# Patient Record
Sex: Female | Born: 1955 | Race: Black or African American | Hispanic: No | Marital: Single | Smoking: Former smoker
Health system: Southern US, Community
[De-identification: ages and names within clinical notes are randomized; demographics above are authoritative.]

## PROBLEM LIST (undated history)

## (undated) DIAGNOSIS — H269 Unspecified cataract: Secondary | ICD-10-CM

## (undated) DIAGNOSIS — M1990 Unspecified osteoarthritis, unspecified site: Secondary | ICD-10-CM

## (undated) DIAGNOSIS — I10 Essential (primary) hypertension: Secondary | ICD-10-CM

## (undated) DIAGNOSIS — T7840XA Allergy, unspecified, initial encounter: Secondary | ICD-10-CM

## (undated) HISTORY — DX: Unspecified osteoarthritis, unspecified site: M19.90

## (undated) HISTORY — DX: Allergy, unspecified, initial encounter: T78.40XA

## (undated) HISTORY — DX: Essential (primary) hypertension: I10

## (undated) HISTORY — DX: Unspecified cataract: H26.9

---

## 2002-05-23 ENCOUNTER — Other Ambulatory Visit: Admission: RE | Admit: 2002-05-23 | Discharge: 2002-05-23 | Payer: Self-pay | Admitting: Obstetrics and Gynecology

## 2002-06-15 ENCOUNTER — Encounter: Admission: RE | Admit: 2002-06-15 | Discharge: 2002-06-15 | Payer: Self-pay | Admitting: Obstetrics and Gynecology

## 2002-11-03 ENCOUNTER — Encounter: Payer: Self-pay | Admitting: Obstetrics and Gynecology

## 2002-11-03 ENCOUNTER — Encounter: Admission: RE | Admit: 2002-11-03 | Discharge: 2002-11-03 | Payer: Self-pay | Admitting: Obstetrics and Gynecology

## 2003-06-19 ENCOUNTER — Other Ambulatory Visit: Admission: RE | Admit: 2003-06-19 | Discharge: 2003-06-19 | Payer: Self-pay | Admitting: Obstetrics and Gynecology

## 2004-01-02 ENCOUNTER — Encounter: Admission: RE | Admit: 2004-01-02 | Discharge: 2004-01-02 | Payer: Self-pay | Admitting: Obstetrics and Gynecology

## 2004-07-09 ENCOUNTER — Encounter: Admission: RE | Admit: 2004-07-09 | Discharge: 2004-07-09 | Payer: Self-pay | Admitting: Obstetrics and Gynecology

## 2005-02-12 ENCOUNTER — Encounter: Admission: RE | Admit: 2005-02-12 | Discharge: 2005-02-12 | Payer: Self-pay | Admitting: Obstetrics and Gynecology

## 2005-02-24 ENCOUNTER — Encounter: Admission: RE | Admit: 2005-02-24 | Discharge: 2005-02-24 | Payer: Self-pay | Admitting: Obstetrics and Gynecology

## 2006-02-23 ENCOUNTER — Encounter: Admission: RE | Admit: 2006-02-23 | Discharge: 2006-02-23 | Payer: Self-pay | Admitting: Obstetrics and Gynecology

## 2007-03-04 ENCOUNTER — Encounter: Admission: RE | Admit: 2007-03-04 | Discharge: 2007-03-04 | Payer: Self-pay | Admitting: Obstetrics and Gynecology

## 2007-06-13 ENCOUNTER — Encounter: Admission: RE | Admit: 2007-06-13 | Discharge: 2007-06-13 | Payer: Self-pay | Admitting: Obstetrics and Gynecology

## 2008-03-27 ENCOUNTER — Encounter: Admission: RE | Admit: 2008-03-27 | Discharge: 2008-03-27 | Payer: Self-pay | Admitting: Obstetrics and Gynecology

## 2008-12-24 ENCOUNTER — Emergency Department (HOSPITAL_COMMUNITY): Admission: EM | Admit: 2008-12-24 | Discharge: 2008-12-24 | Payer: Self-pay | Admitting: Internal Medicine

## 2009-03-28 ENCOUNTER — Encounter: Admission: RE | Admit: 2009-03-28 | Discharge: 2009-03-28 | Payer: Self-pay | Admitting: Obstetrics and Gynecology

## 2010-04-14 ENCOUNTER — Encounter: Admission: RE | Admit: 2010-04-14 | Discharge: 2010-04-14 | Payer: Self-pay | Admitting: Obstetrics and Gynecology
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
## 2011-03-10 ENCOUNTER — Other Ambulatory Visit: Payer: Self-pay | Admitting: Obstetrics and Gynecology

## 2011-03-10 DIAGNOSIS — Z1231 Encounter for screening mammogram for malignant neoplasm of breast: Secondary | ICD-10-CM

## 2011-04-17 ENCOUNTER — Ambulatory Visit
Admission: RE | Admit: 2011-04-17 | Discharge: 2011-04-17 | Disposition: A | Discharge status: Home / Self Care | Payer: BC Managed Care – PPO | Source: Ambulatory Visit | Attending: Obstetrics and Gynecology | Admitting: Obstetrics and Gynecology

## 2011-04-17 DIAGNOSIS — Z1231 Encounter for screening mammogram for malignant neoplasm of breast: Secondary | ICD-10-CM

## 2012-03-03 ENCOUNTER — Other Ambulatory Visit: Payer: Self-pay | Admitting: Obstetrics and Gynecology

## 2012-03-03 DIAGNOSIS — Z1231 Encounter for screening mammogram for malignant neoplasm of breast: Secondary | ICD-10-CM

## 2012-04-18 ENCOUNTER — Ambulatory Visit
Admission: RE | Admit: 2012-04-18 | Discharge: 2012-04-18 | Disposition: A | Discharge status: Home / Self Care | Payer: BC Managed Care – PPO | Source: Ambulatory Visit | Attending: Obstetrics and Gynecology | Admitting: Obstetrics and Gynecology

## 2012-04-18 DIAGNOSIS — Z1231 Encounter for screening mammogram for malignant neoplasm of breast: Secondary | ICD-10-CM

## 2013-03-06 ENCOUNTER — Other Ambulatory Visit: Payer: Self-pay

## 2013-03-06 DIAGNOSIS — Z1231 Encounter for screening mammogram for malignant neoplasm of breast: Secondary | ICD-10-CM

## 2013-04-19 ENCOUNTER — Ambulatory Visit
Admission: RE | Admit: 2013-04-19 | Discharge: 2013-04-19 | Disposition: A | Discharge status: Home / Self Care | Payer: BC Managed Care – PPO | Source: Ambulatory Visit

## 2013-04-19 DIAGNOSIS — Z1231 Encounter for screening mammogram for malignant neoplasm of breast: Secondary | ICD-10-CM

## 2013-10-25 ENCOUNTER — Other Ambulatory Visit: Payer: Self-pay | Admitting: Family

## 2013-10-25 ENCOUNTER — Ambulatory Visit
Admission: RE | Admit: 2013-10-25 | Discharge: 2013-10-25 | Disposition: A | Discharge status: Home / Self Care | Payer: BC Managed Care – PPO | Source: Ambulatory Visit | Attending: Family | Admitting: Family

## 2013-10-25 DIAGNOSIS — M25561 Pain in right knee: Secondary | ICD-10-CM

## 2014-03-19 ENCOUNTER — Other Ambulatory Visit: Payer: Self-pay

## 2014-03-19 DIAGNOSIS — Z1231 Encounter for screening mammogram for malignant neoplasm of breast: Secondary | ICD-10-CM

## 2014-04-12 ENCOUNTER — Other Ambulatory Visit: Payer: Self-pay | Admitting: Family

## 2014-04-13 ENCOUNTER — Other Ambulatory Visit: Payer: Self-pay | Admitting: Family

## 2014-04-13 DIAGNOSIS — R51 Headache: Principal | ICD-10-CM

## 2014-04-13 DIAGNOSIS — R519 Headache, unspecified: Secondary | ICD-10-CM

## 2014-04-23 ENCOUNTER — Ambulatory Visit: Payer: BC Managed Care – PPO

## 2014-05-07 ENCOUNTER — Ambulatory Visit
Admission: RE | Admit: 2014-05-07 | Discharge: 2014-05-07 | Disposition: A | Discharge status: Home / Self Care | Payer: BC Managed Care – PPO | Source: Ambulatory Visit

## 2014-05-07 ENCOUNTER — Inpatient Hospital Stay: Admission: RE | Admit: 2014-05-07 | Payer: BC Managed Care – PPO | Source: Ambulatory Visit

## 2014-05-07 ENCOUNTER — Other Ambulatory Visit: Payer: BC Managed Care – PPO

## 2014-05-07 DIAGNOSIS — Z1231 Encounter for screening mammogram for malignant neoplasm of breast: Secondary | ICD-10-CM

## 2014-05-29 ENCOUNTER — Other Ambulatory Visit: Payer: Self-pay | Admitting: Family

## 2014-05-29 ENCOUNTER — Ambulatory Visit
Admission: RE | Admit: 2014-05-29 | Discharge: 2014-05-29 | Disposition: A | Discharge status: Home / Self Care | Payer: BC Managed Care – PPO | Source: Ambulatory Visit | Attending: Family | Admitting: Family

## 2014-05-29 DIAGNOSIS — G4489 Other headache syndrome: Secondary | ICD-10-CM

## 2014-05-29 DIAGNOSIS — R519 Headache, unspecified: Secondary | ICD-10-CM

## 2014-05-29 DIAGNOSIS — R51 Headache: Principal | ICD-10-CM

## 2015-01-23 ENCOUNTER — Other Ambulatory Visit: Payer: Self-pay | Admitting: Obstetrics and Gynecology

## 2015-01-23 DIAGNOSIS — N644 Mastodynia: Secondary | ICD-10-CM

## 2015-02-18 ENCOUNTER — Other Ambulatory Visit: Payer: Self-pay | Admitting: Family

## 2015-02-18 DIAGNOSIS — N644 Mastodynia: Secondary | ICD-10-CM

## 2015-02-27 ENCOUNTER — Ambulatory Visit
Admission: RE | Admit: 2015-02-27 | Discharge: 2015-02-27 | Disposition: A | Discharge status: Home / Self Care | Payer: BC Managed Care – PPO | Source: Ambulatory Visit | Attending: Family | Admitting: Family

## 2015-02-27 DIAGNOSIS — N644 Mastodynia: Secondary | ICD-10-CM

## 2015-03-06 ENCOUNTER — Other Ambulatory Visit: Payer: Self-pay

## 2015-03-06 ENCOUNTER — Other Ambulatory Visit: Payer: Self-pay | Admitting: Family

## 2015-03-06 DIAGNOSIS — Z1231 Encounter for screening mammogram for malignant neoplasm of breast: Secondary | ICD-10-CM

## 2015-04-17 ENCOUNTER — Ambulatory Visit
Admission: RE | Admit: 2015-04-17 | Discharge: 2015-04-17 | Disposition: A | Discharge status: Home / Self Care | Payer: BC Managed Care – PPO | Source: Ambulatory Visit | Attending: Internal Medicine | Admitting: Internal Medicine

## 2015-04-17 ENCOUNTER — Other Ambulatory Visit: Payer: Self-pay | Admitting: Nurse Practitioner

## 2015-04-17 DIAGNOSIS — M25471 Effusion, right ankle: Secondary | ICD-10-CM

## 2015-05-10 ENCOUNTER — Ambulatory Visit: Payer: BC Managed Care – PPO

## 2015-05-31 ENCOUNTER — Ambulatory Visit
Admission: RE | Admit: 2015-05-31 | Discharge: 2015-05-31 | Disposition: A | Discharge status: Home / Self Care | Payer: BC Managed Care – PPO | Source: Ambulatory Visit

## 2015-05-31 DIAGNOSIS — Z1231 Encounter for screening mammogram for malignant neoplasm of breast: Secondary | ICD-10-CM

## 2015-10-30 ENCOUNTER — Other Ambulatory Visit: Payer: Self-pay | Admitting: Obstetrics and Gynecology

## 2015-10-31 ENCOUNTER — Other Ambulatory Visit: Payer: Self-pay | Admitting: Obstetrics and Gynecology

## 2015-10-31 DIAGNOSIS — N644 Mastodynia: Secondary | ICD-10-CM

## 2015-11-05 ENCOUNTER — Ambulatory Visit
Admission: RE | Admit: 2015-11-05 | Discharge: 2015-11-05 | Disposition: A | Discharge status: Home / Self Care | Payer: BC Managed Care – PPO | Source: Ambulatory Visit | Attending: Obstetrics and Gynecology | Admitting: Obstetrics and Gynecology

## 2015-11-05 DIAGNOSIS — N644 Mastodynia: Secondary | ICD-10-CM

## 2016-04-27 ENCOUNTER — Other Ambulatory Visit: Payer: Self-pay | Admitting: Nurse Practitioner

## 2016-04-27 DIAGNOSIS — Z1231 Encounter for screening mammogram for malignant neoplasm of breast: Secondary | ICD-10-CM

## 2016-06-03 ENCOUNTER — Ambulatory Visit
Admission: RE | Admit: 2016-06-03 | Discharge: 2016-06-03 | Disposition: A | Discharge status: Home / Self Care | Payer: BC Managed Care – PPO | Source: Ambulatory Visit | Attending: Nurse Practitioner | Admitting: Nurse Practitioner

## 2016-06-03 DIAGNOSIS — Z1231 Encounter for screening mammogram for malignant neoplasm of breast: Secondary | ICD-10-CM

## 2016-12-07 ENCOUNTER — Other Ambulatory Visit: Payer: Self-pay | Admitting: Nurse Practitioner

## 2016-12-07 DIAGNOSIS — R5381 Other malaise: Secondary | ICD-10-CM

## 2016-12-21 ENCOUNTER — Other Ambulatory Visit: Payer: Self-pay | Admitting: Nurse Practitioner

## 2016-12-21 DIAGNOSIS — E2839 Other primary ovarian failure: Secondary | ICD-10-CM

## 2016-12-23 ENCOUNTER — Ambulatory Visit
Admission: RE | Admit: 2016-12-23 | Discharge: 2016-12-23 | Disposition: A | Discharge status: Home / Self Care | Payer: BC Managed Care – PPO | Source: Ambulatory Visit | Attending: Nurse Practitioner | Admitting: Nurse Practitioner

## 2016-12-23 DIAGNOSIS — E2839 Other primary ovarian failure: Secondary | ICD-10-CM

## 2017-04-19 ENCOUNTER — Other Ambulatory Visit: Payer: Self-pay | Admitting: Nurse Practitioner

## 2017-04-19 ENCOUNTER — Other Ambulatory Visit: Payer: Self-pay | Admitting: Obstetrics and Gynecology

## 2017-04-19 DIAGNOSIS — Z1231 Encounter for screening mammogram for malignant neoplasm of breast: Secondary | ICD-10-CM

## 2017-06-04 ENCOUNTER — Ambulatory Visit
Admission: RE | Admit: 2017-06-04 | Discharge: 2017-06-04 | Disposition: A | Discharge status: Home / Self Care | Payer: BC Managed Care – PPO | Source: Ambulatory Visit | Attending: Obstetrics and Gynecology | Admitting: Obstetrics and Gynecology

## 2017-06-04 DIAGNOSIS — Z1231 Encounter for screening mammogram for malignant neoplasm of breast: Secondary | ICD-10-CM

## 2017-08-16 ENCOUNTER — Other Ambulatory Visit: Payer: Self-pay | Admitting: Nurse Practitioner

## 2017-08-16 DIAGNOSIS — E2839 Other primary ovarian failure: Secondary | ICD-10-CM

## 2018-02-03 DIAGNOSIS — N08 Glomerular disorders in diseases classified elsewhere: Secondary | ICD-10-CM

## 2018-02-03 DIAGNOSIS — I10 Essential (primary) hypertension: Secondary | ICD-10-CM

## 2018-02-03 DIAGNOSIS — E782 Mixed hyperlipidemia: Secondary | ICD-10-CM | POA: Diagnosis not present

## 2018-02-03 DIAGNOSIS — N390 Urinary tract infection, site not specified: Secondary | ICD-10-CM

## 2018-02-03 DIAGNOSIS — J111 Influenza due to unidentified influenza virus with other respiratory manifestations: Secondary | ICD-10-CM

## 2018-02-03 DIAGNOSIS — R229 Localized swelling, mass and lump, unspecified: Secondary | ICD-10-CM

## 2018-03-01 ENCOUNTER — Other Ambulatory Visit: Payer: Self-pay | Admitting: Nurse Practitioner

## 2018-03-01 DIAGNOSIS — R229 Localized swelling, mass and lump, unspecified: Secondary | ICD-10-CM

## 2018-03-03 ENCOUNTER — Ambulatory Visit
Admission: RE | Admit: 2018-03-03 | Discharge: 2018-03-03 | Disposition: A | Discharge status: Home / Self Care | Payer: BC Managed Care – PPO | Source: Ambulatory Visit | Attending: Nurse Practitioner | Admitting: Nurse Practitioner

## 2018-03-03 ENCOUNTER — Other Ambulatory Visit: Payer: BC Managed Care – PPO

## 2018-03-03 DIAGNOSIS — R229 Localized swelling, mass and lump, unspecified: Secondary | ICD-10-CM

## 2018-03-08 ENCOUNTER — Telehealth: Payer: Self-pay

## 2018-03-08 NOTE — Telephone Encounter (Signed)
Pt left a message on patient portal in [facility] requesting her lab results from her last visit. YRL,RMA

## 2018-03-09 NOTE — Telephone Encounter (Signed)
Kidney functions are stable. White count is normal.  These are from 02/18/18

## 2018-03-09 NOTE — Telephone Encounter (Signed)
Left pt v/m to call office regarding labs she was requesting. YRL,RMA

## 2018-03-10 NOTE — Telephone Encounter (Signed)
Urine culture was negative.

## 2018-03-10 NOTE — Telephone Encounter (Signed)
Patient notified she also wanted to know the results for her recheck on the urine?

## 2018-03-11 NOTE — Telephone Encounter (Signed)
Left pt v/m to call office. YRL,RMA 

## 2018-03-15 NOTE — Telephone Encounter (Signed)
Left pt v/m to call office. YRL,RMA 

## 2018-03-17 NOTE — Telephone Encounter (Signed)
Left pt v/m to call office. YRL,RMA 

## 2018-03-18 ENCOUNTER — Telehealth: Payer: Self-pay

## 2018-03-18 NOTE — Telephone Encounter (Signed)
I called patient and informed her the results of her ultrasound was sent to her last week with the interpretation.

## 2018-03-18 NOTE — Telephone Encounter (Signed)
Patient called requesting results from her ultrasound? YRL,RMA

## 2018-03-18 NOTE — Telephone Encounter (Signed)
Patient notified YRL,RMA 

## 2018-04-25 ENCOUNTER — Other Ambulatory Visit: Payer: Self-pay | Admitting: Obstetrics and Gynecology

## 2018-04-25 ENCOUNTER — Other Ambulatory Visit: Payer: Self-pay | Admitting: Nurse Practitioner

## 2018-04-25 DIAGNOSIS — Z1231 Encounter for screening mammogram for malignant neoplasm of breast: Secondary | ICD-10-CM

## 2018-04-25 DIAGNOSIS — E2839 Other primary ovarian failure: Secondary | ICD-10-CM

## 2018-06-06 ENCOUNTER — Ambulatory Visit
Admission: RE | Admit: 2018-06-06 | Discharge: 2018-06-06 | Disposition: A | Discharge status: Home / Self Care | Payer: BC Managed Care – PPO | Source: Ambulatory Visit | Attending: Obstetrics and Gynecology | Admitting: Obstetrics and Gynecology

## 2018-06-06 DIAGNOSIS — Z1231 Encounter for screening mammogram for malignant neoplasm of breast: Secondary | ICD-10-CM

## 2018-06-09 ENCOUNTER — Other Ambulatory Visit: Payer: Self-pay | Admitting: Nurse Practitioner

## 2018-08-10 ENCOUNTER — Encounter: Payer: Self-pay | Admitting: Nurse Practitioner

## 2018-08-30 ENCOUNTER — Encounter: Payer: Self-pay | Admitting: Internal Medicine

## 2018-08-30 ENCOUNTER — Other Ambulatory Visit: Payer: Self-pay

## 2018-08-30 ENCOUNTER — Encounter: Payer: Self-pay | Admitting: Nurse Practitioner

## 2018-08-30 ENCOUNTER — Ambulatory Visit: Payer: BC Managed Care – PPO | Admitting: Nurse Practitioner

## 2018-08-30 VITALS — BP 130/72 | HR 62 | Temp 98.4°F | Ht 62.0 in | Wt 188.8 lb

## 2018-08-30 DIAGNOSIS — R7303 Prediabetes: Secondary | ICD-10-CM | POA: Insufficient documentation

## 2018-08-30 DIAGNOSIS — I10 Essential (primary) hypertension: Secondary | ICD-10-CM | POA: Diagnosis not present

## 2018-08-30 DIAGNOSIS — B309 Viral conjunctivitis, unspecified: Secondary | ICD-10-CM | POA: Diagnosis not present

## 2018-08-30 DIAGNOSIS — Z0000 Encounter for general adult medical examination without abnormal findings: Secondary | ICD-10-CM

## 2018-08-30 LAB — POCT URINALYSIS DIPSTICK
Bilirubin, UA: NEGATIVE
GLUCOSE UA: NEGATIVE
Ketones, UA: NEGATIVE
LEUKOCYTES UA: NEGATIVE
Nitrite, UA: NEGATIVE
PROTEIN UA: NEGATIVE
Spec Grav, UA: >=1.03 — AB (ref 1.010–1.025)
Urobilinogen, UA: 0.2 E.U./dL
pH, UA: 6 (ref 5.0–8.0)

## 2018-08-30 LAB — POCT UA - MICROALBUMIN
Albumin/Creatinine Ratio, Urine, POC: <30
CREATININE, POC: 200 mg/dL
MICROALBUMIN (UR) POC: 30 mg/L

## 2018-08-30 NOTE — Patient Instructions (Addendum)
Health Maintenance, Female Adopting a healthy lifestyle and getting preventive care can go a long way to promote health and wellness. Talk with your health care provider about what schedule of regular examinations is right for you. This is a good chance for you to check in with your provider about disease prevention and staying healthy. In between checkups, there are plenty of things you can do on your own. Experts have done a lot of research about which lifestyle changes and preventive measures are most likely to keep you healthy. Ask your health care provider for more information. Weight and diet Eat a healthy diet  Be sure to include plenty of vegetables, fruits, low-fat dairy products, and lean protein.  Do not eat a lot of foods high in solid fats, added sugars, or salt.  Get regular exercise. This is one of the most important things you can do for your health. ? Most adults should exercise for at least 150 minutes each week. The exercise should increase your heart rate and make you sweat (moderate-intensity exercise). ? Most adults should also do strengthening exercises at least twice a week. This is in addition to the moderate-intensity exercise. Maintain a healthy weight  Body mass index (BMI) is a measurement that can be used to identify possible weight problems. It estimates body fat based on height and weight. Your health care provider can help determine your BMI and help you achieve or maintain a healthy weight.  For females 20 years of age and older: ? A BMI below 18.5 is considered underweight. ? A BMI of 18.5 to 24.9 is normal. ? A BMI of 25 to 29.9 is considered overweight. ? A BMI of 30 and above is considered obese. Watch levels of cholesterol and blood lipids  You should start having your blood tested for lipids and cholesterol at 63 years of age, then have this test every 5 years.  You may need to have your cholesterol levels checked more often if: ? Your lipid or  cholesterol levels are high. ? You are older than 63 years of age. ? You are at high risk for heart disease. Cancer screening Lung Cancer  Lung cancer screening is recommended for adults 55-80 years old who are at high risk for lung cancer because of a history of smoking.  A yearly low-dose CT scan of the lungs is recommended for people who: ? Currently smoke. ? Have quit within the past 15 years. ? Have at least a 30-pack-year history of smoking. A pack year is smoking an average of one pack of cigarettes a day for 1 year.  Yearly screening should continue until it has been 15 years since you quit.  Yearly screening should stop if you develop a health problem that would prevent you from having lung cancer treatment. Breast Cancer  Practice breast self-awareness. This means understanding how your breasts normally appear and feel.  It also means doing regular breast self-exams. Let your health care provider know about any changes, no matter how small.  If you are in your 20s or 30s, you should have a clinical breast exam (CBE) by a health care provider every 1-3 years as part of a regular health exam.  If you are 40 or older, have a CBE every year. Also consider having a breast X-ray (mammogram) every year.  If you have a family history of breast cancer, talk to your health care provider about genetic screening.  If you are at high risk for breast cancer, talk   to your health care provider about having an MRI and a mammogram every year.  Breast cancer gene (BRCA) assessment is recommended for women who have family members with BRCA-related cancers. BRCA-related cancers include: ? Breast. ? Ovarian. ? Tubal. ? Peritoneal cancers.  Results of the assessment will determine the need for genetic counseling and BRCA1 and BRCA2 testing. Cervical Cancer Your health care provider may recommend that you be screened regularly for cancer of the pelvic organs (ovaries, uterus, and vagina).  This screening involves a pelvic examination, including checking for microscopic changes to the surface of your cervix (Pap test). You may be encouraged to have this screening done every 3 years, beginning at age 21.  For women ages 30-65, health care providers may recommend pelvic exams and Pap testing every 3 years, or they may recommend the Pap and pelvic exam, combined with testing for human papilloma virus (HPV), every 5 years. Some types of HPV increase your risk of cervical cancer. Testing for HPV may also be done on women of any age with unclear Pap test results.  Other health care providers may not recommend any screening for nonpregnant women who are considered low risk for pelvic cancer and who do not have symptoms. Ask your health care provider if a screening pelvic exam is right for you.  If you have had past treatment for cervical cancer or a condition that could lead to cancer, you need Pap tests and screening for cancer for at least 20 years after your treatment. If Pap tests have been discontinued, your risk factors (such as having a new sexual partner) need to be reassessed to determine if screening should resume. Some women have medical problems that increase the chance of getting cervical cancer. In these cases, your health care provider may recommend more frequent screening and Pap tests. Colorectal Cancer  This type of cancer can be detected and often prevented.  Routine colorectal cancer screening usually begins at 63 years of age and continues through 63 years of age.  Your health care provider may recommend screening at an earlier age if you have risk factors for colon cancer.  Your health care provider may also recommend using home test kits to check for hidden blood in the stool.  A small camera at the end of a tube can be used to examine your colon directly (sigmoidoscopy or colonoscopy). This is done to check for the earliest forms of colorectal cancer.  Routine  screening usually begins at age 50.  Direct examination of the colon should be repeated every 5-10 years through 63 years of age. However, you may need to be screened more often if early forms of precancerous polyps or small growths are found. Skin Cancer  Check your skin from head to toe regularly.  Tell your health care provider about any new moles or changes in moles, especially if there is a change in a mole's shape or color.  Also tell your health care provider if you have a mole that is larger than the size of a pencil eraser.  Always use sunscreen. Apply sunscreen liberally and repeatedly throughout the day.  Protect yourself by wearing long sleeves, pants, a wide-brimmed hat, and sunglasses whenever you are outside. Heart disease, diabetes, and high blood pressure  High blood pressure causes heart disease and increases the risk of stroke. High blood pressure is more likely to develop in: ? People who have blood pressure in the high end of the normal range (130-139/85-89 mm Hg). ? People   who are overweight or obese. ? People who are African American.  If you are 84-22 years of age, have your blood pressure checked every 3-5 years. If you are 67 years of age or older, have your blood pressure checked every year. You should have your blood pressure measured twice-once when you are at a hospital or clinic, and once when you are not at a hospital or clinic. Record the average of the two measurements. To check your blood pressure when you are not at a hospital or clinic, you can use: ? An automated blood pressure machine at a pharmacy. ? A home blood pressure monitor.  If you are between 52 years and 3 years old, ask your health care provider if you should take aspirin to prevent strokes.  Have regular diabetes screenings. This involves taking a blood sample to check your fasting blood sugar level. ? If you are at a normal weight and have a low risk for diabetes, have this test once  every three years after 63 years of age. ? If you are overweight and have a high risk for diabetes, consider being tested at a younger age or more often. Preventing infection Hepatitis B  If you have a higher risk for hepatitis B, you should be screened for this virus. You are considered at high risk for hepatitis B if: ? You were born in a country where hepatitis B is common. Ask your health care provider which countries are considered high risk. ? Your parents were born in a high-risk country, and you have not been immunized against hepatitis B (hepatitis B vaccine). ? You have HIV or AIDS. ? You use needles to inject street drugs. ? You live with someone who has hepatitis B. ? You have had sex with someone who has hepatitis B. ? You get hemodialysis treatment. ? You take certain medicines for conditions, including cancer, organ transplantation, and autoimmune conditions. Hepatitis C  Blood testing is recommended for: ? Everyone born from [birth year] through 1965. ? Anyone with known risk factors for hepatitis C. Sexually transmitted infections (STIs)  You should be screened for sexually transmitted infections (STIs) including gonorrhea and chlamydia if: ? You are sexually active and are younger than 63 years of age. ? You are older than 63 years of age and your health care provider tells you that you are at risk for this type of infection. ? Your sexual activity has changed since you were last screened and you are at an increased risk for chlamydia or gonorrhea. Ask your health care provider if you are at risk.  If you do not have HIV, but are at risk, it may be recommended that you take a prescription medicine daily to prevent HIV infection. This is called pre-exposure prophylaxis (PrEP). You are considered at risk if: ? You are sexually active and do not regularly use condoms or know the HIV status of your partner(s). ? You take drugs by injection. ? You are sexually active with a partner  who has HIV. Talk with your health care provider about whether you are at high risk of being infected with HIV. If you choose to begin PrEP, you should first be tested for HIV. You should then be tested every 3 months for as long as you are taking PrEP. Pregnancy  If you are premenopausal and you may become pregnant, ask your health care provider about preconception counseling.  If you may become pregnant, take 400 to 800 micrograms (mcg) of folic acid every  day.  If you want to prevent pregnancy, talk to your health care provider about birth control (contraception). Osteoporosis and menopause  Osteoporosis is a disease in which the bones lose minerals and strength with aging. This can result in serious bone fractures. Your risk for osteoporosis can be identified using a bone density scan.  If you are 60 years of age or older, or if you are at risk for osteoporosis and fractures, ask your health care provider if you should be screened.  Ask your health care provider whether you should take a calcium or vitamin D supplement to lower your risk for osteoporosis.  Menopause may have certain physical symptoms and risks.  Hormone replacement therapy may reduce some of these symptoms and risks. Talk to your health care provider about whether hormone replacement therapy is right for you. Follow these instructions at home:  Schedule regular health, dental, and eye exams.  Stay current with your immunizations.  Do not use any tobacco products including cigarettes, chewing tobacco, or electronic cigarettes.  If you are pregnant, do not drink alcohol.  If you are breastfeeding, limit how much and how often you drink alcohol.  Limit alcohol intake to no more than 1 drink per day for nonpregnant women. One drink equals 12 ounces of beer, 5 ounces of wine, or 1 ounces of hard liquor.  Do not use street drugs.  Do not share needles.  Ask your health care provider for help if you need support  or information about quitting drugs.  Tell your health care provider if you often feel depressed.  Tell your health care provider if you have ever been abused or do not feel safe at home. This information is not intended to replace advice given to you by your health care provider. Make sure you discuss any questions you have with your health care provider. Document Released: 12/01/2010 Document Revised: 10/24/2015 Document Reviewed: 02/19/2015 [employer] Interactive Patient Education  2019 [employer]   Viral Conjunctivitis, Adult  Viral conjunctivitis is an inflammation of the clear membrane that covers the white part of your eye and the inner surface of your eyelid (conjunctiva). The inflammation is caused by a viral infection. The blood vessels in the conjunctiva become inflamed, causing the eye to become red or pink, and often itchy. Viral conjunctivitis can be easily passed from one person to another (is contagious). This condition is often called pink eye. What are the causes? This condition is caused by a virus. A virus is a type of contagious germ. It can be spread by touching objects that have been contaminated with the virus, such as doorknobs or towels. It can also be passed through droplets, such as from coughing or sneezing. What are the signs or symptoms? Symptoms of this condition include:  Eye redness.  Tearing or watery eyes.  Itchy and irritated eyes.  Burning feeling in the eyes.  Clear drainage from the eye.  Swollen eyelids.  A gritty feeling in the eye.  Light sensitivity. This condition often occurs with other symptoms, such as a fever, nausea, or a rash. How is this diagnosed? This condition is diagnosed with a medical history and physical exam. If you have discharge from your eye, the discharge may be tested to rule out other causes of conjunctivitis. How is this treated? Viral conjunctivitis does not respond to medicines that kill bacteria  (antibiotics). Treatment for viral conjunctivitis is directed at stopping a bacterial infection from developing in addition to the viral infection. Treatment also  aims to relieve your symptoms, such as itching. This may be done with antihistamine drops or other eye medicines. Rarely, steroid eye drops or antiviral medicines may be prescribed. Follow these instructions at home: Medicines   Take or apply over-the-counter and prescription medicines only as told by your health care provider.  Be very careful to avoid touching the edge of the eyelid with the eye drop bottle or ointment tube when applying medicines to the affected eye. Being careful this way will stop you from spreading the infection to the other eye or to other people. Eye care  Avoid touching or rubbing your eyes.  Apply a warm, wet, clean washcloth to your eye for 10-20 minutes, 3-4 times per day or as told by your health care provider.  If you wear contact lenses, do not wear them until the inflammation is gone and your health care provider says it is safe to wear them again. Ask your health care provider how to sterilize or replace your contact lenses before using them again. Wear glasses until you can resume wearing contacts.  Avoid wearing eye makeup until the inflammation is gone. Throw away any old eye cosmetics that may be contaminated.  Gently wipe away any drainage from your eye with a warm, wet washcloth or a cotton ball. General instructions  Change or wash your pillowcase every day or as told by your health care provider.  Do not share towels, pillowcases, washcloths, eye makeup, makeup brushes, contact lenses, or glasses. This may spread the infection.  Wash your hands often with soap and water. Use paper towels to dry your hands. If soap and water are not available, use hand sanitizer.  Try to avoid contact with other people for one week or as told by your health care provider. Contact a health care provider  if:  Your symptoms do not improve with treatment or they get worse.  You have increased pain.  Your vision becomes blurry.  You have a fever.  You have facial pain, redness, or swelling.  You have yellow or green drainage coming from your eye.  You have new symptoms. This information is not intended to replace advice given to you by your health care provider. Make sure you discuss any questions you have with your health care provider. Document Released: 08/08/2002 Document Revised: 12/14/2015 Document Reviewed: 12/03/2015 [employer] Interactive Patient Education  2019 [place] [employer]YE [employer] - [place]

## 2018-08-30 NOTE — Progress Notes (Signed)
poc

## 2018-08-30 NOTE — Progress Notes (Signed)
Subjective:     Patient ID: Joanna Powell , female    DOB: [birth year] , 63 y.o.   MRN: [id number]   Chief Complaint  Patient presents with  . Annual Exam   The patient states she uses post menopausal status for birth control. Last LMP was No LMP recorded. Patient is postmenopausal.. Negative for Dysmenorrhea and Negative for Menorrhagia Mammogram last done 06/06/2018.  Negative for: breast discharge, breast lump(s), breast pain and breast self exam.  Pertinent negatives include abnormal bleeding (hematology), anxiety, decreased libido, depression, difficulty falling sleep, dyspareunia, history of infertility, nocturia, sexual dysfunction, sleep disturbances, urinary incontinence, urinary urgency, vaginal discharge and vaginal itching. Diet regular. The patient states her exercise level is   minimally.     The patient's tobacco use is:  Social History   Tobacco Use  Smoking Status Former Smoker  Smokeless Tobacco Never Used   She has been exposed to passive smoke. The patient's alcohol use is:  Social History   Substance and Sexual Activity  Alcohol Use Yes   Comment: occasional   Additional information: Last pap 2019 with Dr. Cherly Hensen, next one scheduled for April/May 2020 HPI  Here for HM  Hyperlipidemia - doing well.   Hypertension  Pertinent negatives include no blurred vision. Risk factors for coronary artery disease include dyslipidemia and sedentary lifestyle. Past treatments include angiotensin blockers. Compliance problems include exercise.  There is no history of angina.  Eye Problem   The left eye is affected. This is a new problem. The current episode started in the past 7 days. The problem occurs constantly. There was no injury mechanism. The patient is experiencing no pain. There is no known exposure to pink eye. She does not wear contacts. Associated symptoms include eye redness. Pertinent negatives include no blurred vision. She has tried nothing for the symptoms.       History reviewed. No pertinent past medical history.   Family History  Problem Relation Age of Onset  . Breast cancer Sister   . Cancer Mother   . Hypertension Mother   . Hypertension Father   . Heart disease Father      Current Outpatient Medications:  .  atorvastatin (LIPITOR) 10 MG tablet, Take 10 mg by mouth daily., Disp: , Rfl:  .  olmesartan (BENICAR) 40 MG tablet, TAKE 1 TABLET BY MOUTH EVERY DAY, Disp: 90 tablet, Rfl: 1   Not on File   Review of Systems  Eyes: Positive for redness. Negative for blurred vision.    Today's Vitals   08/30/18 1103  BP: 130/72  Pulse: 62  Temp: 98.4 F (36.9 C)  TempSrc: Oral  SpO2: 95%  Weight: 188 lb 12.8 oz (85.6 kg)  Height: 5\' 2"  (1.575 m)   Body mass index is 34.53 kg/m.   Objective:  Physical Exam Constitutional:      General: She is not in acute distress.    Appearance: Normal appearance. She is well-developed.  HENT:     Head: Normocephalic and atraumatic.     Right Ear: Hearing, tympanic membrane, ear canal and external ear normal.     Left Ear: Hearing, tympanic membrane, ear canal and external ear normal.     Nose: Nose normal.     Mouth/Throat:     Mouth: Mucous membranes are moist.  Eyes:     General: Lids are normal.        Left eye: Discharge (Dried crust near inner) present.    Extraocular Movements: Extraocular movements intact.  Pupils: Pupils are equal, round, and reactive to light.     Funduscopic exam:    Right eye: No papilledema.        Left eye: No papilledema.  Neck:     Musculoskeletal: Full passive range of motion without pain, normal range of motion and neck supple.     Thyroid: No thyroid mass.     Vascular: No carotid bruit.  Cardiovascular:     Rate and Rhythm: Normal rate and regular rhythm.     Pulses: Normal pulses.     Heart sounds: Normal heart sounds. No murmur.  Pulmonary:     Effort: Pulmonary effort is normal. No respiratory distress.     Breath sounds: Normal breath  sounds.  Abdominal:     General: Abdomen is flat. Bowel sounds are normal.     Palpations: Abdomen is soft.  Musculoskeletal: Normal range of motion.        General: No swelling.     Right lower leg: No edema.     Left lower leg: No edema.  Skin:    General: Skin is warm and dry.     Capillary Refill: Capillary refill takes less than 2 seconds.  Neurological:     General: No focal deficit present.     Mental Status: She is alert and oriented to person, place, and time.     Cranial Nerves: No cranial nerve deficit.     Sensory: No sensory deficit.  Psychiatric:        Mood and Affect: Mood normal.        Behavior: Behavior normal.        Thought Content: Thought content normal.        Judgment: Judgment normal.         Assessment And Plan:      1. Health maintenance examination . Behavior modifications discussed and diet history reviewed.   . Pt will continue to exercise regularly and modify diet with low GI, plant based foods and decrease intake of processed foods.  . Recommend intake of daily multivitamin, Vitamin D, and calcium.  . Recommend mammogram and colonoscopy for preventive screenings, as well as recommend immunizations that include influenza, TDAP - POCT Urinalysis Dipstick (81002) - POCT UA - Microalbumin - Hemoglobin A1c - Lipid panel - CMP14 + Anion Gap - CBC no Diff  2. Essential hypertension . B/P is controlled.  . CMP ordered to check renal function.  . The importance of regular exercise and dietary modification was stressed to the patient.   3. Viral conjunctivitis of left eye  No matting noted,   2 day history of eye matting in am, no antibiotics at this time.    Good handwashing  4. Prediabetes  Chronic, stable  No current medications  Encouraged to limit intake of sugary foods and drinks  Encouraged to increase physical activity to 150 minutes per week   Arnette Felts, FNP

## 2018-08-31 LAB — CMP14 + ANION GAP
ALT: 15 IU/L (ref 0–32)
ANION GAP: 14 mmol/L (ref 10.0–18.0)
AST: 20 IU/L (ref 0–40)
Albumin/Globulin Ratio: 1.6 (ref 1.2–2.2)
Albumin: 4.6 g/dL (ref 3.8–4.8)
Alkaline Phosphatase: 94 IU/L (ref 39–117)
BUN/Creatinine Ratio: 19 (ref 12–28)
BUN: 18 mg/dL (ref 8–27)
Bilirubin Total: 0.6 mg/dL (ref 0.0–1.2)
CO2: 26 mmol/L (ref 20–29)
CREATININE: 0.96 mg/dL (ref 0.57–1.00)
Calcium: 9.8 mg/dL (ref 8.7–10.3)
Chloride: 103 mmol/L (ref 96–106)
GFR calc Af Amer: 73 mL/min/{1.73_m2} (ref >59)
GFR calc non Af Amer: 63 mL/min/{1.73_m2} (ref >59)
Globulin, Total: 2.8 g/dL (ref 1.5–4.5)
Glucose: 88 mg/dL (ref 65–99)
Potassium: 5 mmol/L (ref 3.5–5.2)
Sodium: 143 mmol/L (ref 134–144)
Total Protein: 7.4 g/dL (ref 6.0–8.5)

## 2018-08-31 LAB — CBC
HEMOGLOBIN: 13.4 g/dL (ref 11.1–15.9)
Hematocrit: 41.1 % (ref 34.0–46.6)
MCH: 26.5 pg — ABNORMAL LOW (ref 26.6–33.0)
MCHC: 32.6 g/dL (ref 31.5–35.7)
MCV: 81 fL (ref 79–97)
Platelets: 436 10*3/uL (ref 150–450)
RBC: 5.06 x10E6/uL (ref 3.77–5.28)
RDW: 14 % (ref 11.7–15.4)
WBC: 4.7 10*3/uL (ref 3.4–10.8)

## 2018-08-31 LAB — HEMOGLOBIN A1C
Est. average glucose Bld gHb Est-mCnc: 120 mg/dL
Hgb A1c MFr Bld: 5.8 % — ABNORMAL HIGH (ref 4.8–5.6)

## 2018-08-31 LAB — LIPID PANEL
CHOL/HDL RATIO: 3.8 ratio (ref 0.0–4.4)
Cholesterol, Total: 220 mg/dL — ABNORMAL HIGH (ref 100–199)
HDL: 58 mg/dL (ref >39)
LDL CALC: 138 mg/dL — AB (ref 0–99)
Triglycerides: 122 mg/dL (ref 0–149)
VLDL Cholesterol Cal: 24 mg/dL (ref 5–40)

## 2018-09-28 ENCOUNTER — Other Ambulatory Visit: Payer: Self-pay | Admitting: Nurse Practitioner

## 2018-10-06 ENCOUNTER — Encounter: Payer: Self-pay | Admitting: Nurse Practitioner

## 2018-12-26 ENCOUNTER — Ambulatory Visit
Admission: RE | Admit: 2018-12-26 | Discharge: 2018-12-26 | Disposition: A | Discharge status: Home / Self Care | Payer: BC Managed Care – PPO | Source: Ambulatory Visit | Attending: Nurse Practitioner | Admitting: Nurse Practitioner

## 2018-12-26 ENCOUNTER — Other Ambulatory Visit: Payer: Self-pay

## 2018-12-26 DIAGNOSIS — E2839 Other primary ovarian failure: Secondary | ICD-10-CM

## 2019-03-02 ENCOUNTER — Ambulatory Visit: Payer: BC Managed Care – PPO | Admitting: Nurse Practitioner

## 2019-03-16 ENCOUNTER — Ambulatory Visit: Payer: BC Managed Care – PPO | Admitting: Nurse Practitioner

## 2019-03-16 ENCOUNTER — Encounter: Payer: Self-pay | Admitting: Nurse Practitioner

## 2019-03-16 ENCOUNTER — Other Ambulatory Visit: Payer: Self-pay

## 2019-03-16 VITALS — BP 142/100 | HR 64 | Temp 98.7°F | Ht 61.8 in | Wt 193.8 lb

## 2019-03-16 DIAGNOSIS — R002 Palpitations: Secondary | ICD-10-CM | POA: Diagnosis not present

## 2019-03-16 DIAGNOSIS — R7303 Prediabetes: Secondary | ICD-10-CM | POA: Diagnosis not present

## 2019-03-16 DIAGNOSIS — I10 Essential (primary) hypertension: Secondary | ICD-10-CM | POA: Diagnosis not present

## 2019-03-16 NOTE — Progress Notes (Signed)
Subjective:     Patient ID: Joanna Powell , female    DOB: 06/16/1955 , 63 y.o.   MRN: [id number]   Chief Complaint  Patient presents with  . Hypertension  . covid testing    patient would like to be tested for covid because she heard some poeple can be asymptomatic    HPI   Hyperlipidemia - doing well.   She has not taken her blood pressure medications this morning.  She has been eating more chips and popcorn.  She is walking 3-4 times per week average.    Her brother did have triple bypass in the last year  Hypertension Pertinent negatives include no chest pain, headaches or palpitations. Risk factors for coronary artery disease include dyslipidemia and sedentary lifestyle. Past treatments include angiotensin blockers. Compliance problems include diet.  There is no history of angina. There is no history of chronic renal disease.  Palpitations  This is a new problem. The current episode started 1 to 4 weeks ago. The problem occurs intermittently. The symptoms are aggravated by unknown. Pertinent negatives include no chest pain, coughing, dizziness or syncope.     No past medical history on file.   Family History  Problem Relation Age of Onset  . Breast cancer Sister   . Cancer Mother   . Hypertension Mother   . Hypertension Father   . Heart disease Father      Current Outpatient Medications:  .  atorvastatin (LIPITOR) 10 MG tablet, TAKE 1 TABLET BY MOUTH EVERY DAY, Disp: 90 tablet, Rfl: 1 .  olmesartan (BENICAR) 40 MG tablet, TAKE 1 TABLET BY MOUTH EVERY DAY, Disp: 90 tablet, Rfl: 1   No Known Allergies   Review of Systems  Constitutional: Negative.   Eyes: Positive for redness.  Respiratory: Negative.  Negative for cough.   Cardiovascular: Negative.  Negative for chest pain, palpitations, leg swelling and syncope.  Neurological: Negative for dizziness and headaches.  Psychiatric/Behavioral: Negative.      Today's Vitals   03/16/19 1004  BP: (!) 142/100  Pulse:  64  Temp: 98.7 F (37.1 C)  TempSrc: Oral  Weight: 193 lb 12.8 oz (87.9 kg)  Height: 5' 1.8" (1.57 m)  PainSc: 0-No pain   Body mass index is 35.68 kg/m.   Objective:  Physical Exam Constitutional:      General: She is not in acute distress.    Appearance: Normal appearance. She is well-developed.  HENT:     Head: Normocephalic.     Right Ear: Hearing normal.     Left Ear: Hearing normal.  Eyes:     General: Lids are normal.        Left eye: No discharge.     Extraocular Movements: Extraocular movements intact.     Pupils: Pupils are equal, round, and reactive to light.     Funduscopic exam:    Right eye: No papilledema.        Left eye: No papilledema.  Neck:     Musculoskeletal: Full passive range of motion without pain, normal range of motion and neck supple.     Thyroid: No thyroid mass.     Vascular: No carotid bruit.  Cardiovascular:     Rate and Rhythm: Normal rate and regular rhythm.     Pulses: Normal pulses.     Heart sounds: Normal heart sounds. No murmur.  Pulmonary:     Effort: Pulmonary effort is normal. No respiratory distress.     Breath sounds: Normal breath  sounds.  Abdominal:     General: Abdomen is flat. Bowel sounds are normal.     Palpations: Abdomen is soft.  Musculoskeletal: Normal range of motion.        General: No swelling.     Right lower leg: No edema.     Left lower leg: No edema.  Skin:    General: Skin is warm and dry.     Capillary Refill: Capillary refill takes less than 2 seconds.  Neurological:     General: No focal deficit present.     Mental Status: She is alert and oriented to person, place, and time.     Cranial Nerves: No cranial nerve deficit.     Sensory: No sensory deficit.  Psychiatric:        Mood and Affect: Mood normal.        Behavior: Behavior normal.        Thought Content: Thought content normal.        Judgment: Judgment normal.         Assessment And Plan:     1. Essential hypertension . B/P is  controlled.  Marland Kitchen BMP ordered to check renal function.  . The importance of regular exercise and dietary modification was stressed to the patient.  - BMP8+eGFR  2. Prediabetes  Chronic, stable.   Continue to control with diet and exercise.  3. Palpitations  EKG revealed Sinus Rhythm heart rate is 54, this is normal for her  I will check her TSH as well. - BMP8+eGFR - CBC with Differential/Platelet - EKG 12-Lead - TSH   Discussed coronavirus testing and being asymptomatic.  She decided not to be checked for coronavirus Minette Brine, FNP    THE PATIENT IS ENCOURAGED TO PRACTICE SOCIAL DISTANCING DUE TO THE COVID-19 PANDEMIC.

## 2019-03-17 LAB — CBC WITH DIFFERENTIAL/PLATELET
Basophils Absolute: 0.1 10*3/uL (ref 0.0–0.2)
Basos: 1 %
EOS (ABSOLUTE): 0.1 10*3/uL (ref 0.0–0.4)
Eos: 3 %
Hematocrit: 42.4 % (ref 34.0–46.6)
Hemoglobin: 13.5 g/dL (ref 11.1–15.9)
Immature Grans (Abs): 0 10*3/uL (ref 0.0–0.1)
Immature Granulocytes: 0 %
Lymphocytes Absolute: 1.6 10*3/uL (ref 0.7–3.1)
Lymphs: 35 %
MCH: 26.3 pg — ABNORMAL LOW (ref 26.6–33.0)
MCHC: 31.8 g/dL (ref 31.5–35.7)
MCV: 83 fL (ref 79–97)
Monocytes Absolute: 0.4 10*3/uL (ref 0.1–0.9)
Monocytes: 8 %
Neutrophils Absolute: 2.4 10*3/uL (ref 1.4–7.0)
Neutrophils: 53 %
Platelets: 427 10*3/uL (ref 150–450)
RBC: 5.14 x10E6/uL (ref 3.77–5.28)
RDW: 13.9 % (ref 11.7–15.4)
WBC: 4.6 10*3/uL (ref 3.4–10.8)

## 2019-03-17 LAB — BMP8+EGFR
BUN/Creatinine Ratio: 12 (ref 12–28)
BUN: 15 mg/dL (ref 8–27)
CO2: 25 mmol/L (ref 20–29)
Calcium: 9.9 mg/dL (ref 8.7–10.3)
Chloride: 103 mmol/L (ref 96–106)
Creatinine, Ser: 1.22 mg/dL — ABNORMAL HIGH (ref 0.57–1.00)
GFR calc Af Amer: 54 mL/min/{1.73_m2} — ABNORMAL LOW (ref >59)
GFR calc non Af Amer: 47 mL/min/{1.73_m2} — ABNORMAL LOW (ref >59)
Glucose: 100 mg/dL — ABNORMAL HIGH (ref 65–99)
Potassium: 4.6 mmol/L (ref 3.5–5.2)
Sodium: 142 mmol/L (ref 134–144)

## 2019-03-17 LAB — TSH: TSH: 1.26 u[IU]/mL (ref 0.450–4.500)

## 2019-03-23 ENCOUNTER — Encounter: Payer: Self-pay | Admitting: Nurse Practitioner

## 2019-03-28 ENCOUNTER — Other Ambulatory Visit: Payer: Self-pay | Admitting: Nurse Practitioner

## 2019-03-29 ENCOUNTER — Other Ambulatory Visit: Payer: Self-pay | Admitting: Nurse Practitioner

## 2019-05-08 ENCOUNTER — Other Ambulatory Visit: Payer: Self-pay | Admitting: Obstetrics and Gynecology

## 2019-05-08 DIAGNOSIS — Z1231 Encounter for screening mammogram for malignant neoplasm of breast: Secondary | ICD-10-CM

## 2019-06-27 ENCOUNTER — Other Ambulatory Visit: Payer: Self-pay

## 2019-06-27 ENCOUNTER — Ambulatory Visit
Admission: RE | Admit: 2019-06-27 | Discharge: 2019-06-27 | Disposition: A | Discharge status: Home / Self Care | Payer: BC Managed Care – PPO | Source: Ambulatory Visit | Attending: Obstetrics and Gynecology | Admitting: Obstetrics and Gynecology

## 2019-06-27 DIAGNOSIS — Z1231 Encounter for screening mammogram for malignant neoplasm of breast: Secondary | ICD-10-CM

## 2019-08-31 ENCOUNTER — Encounter: Payer: Self-pay | Admitting: Nurse Practitioner

## 2019-08-31 ENCOUNTER — Ambulatory Visit (INDEPENDENT_AMBULATORY_CARE_PROVIDER_SITE_OTHER): Payer: BC Managed Care – PPO | Admitting: Nurse Practitioner

## 2019-08-31 ENCOUNTER — Other Ambulatory Visit: Payer: Self-pay

## 2019-08-31 VITALS — BP 124/82 | HR 70 | Temp 98.6°F | Ht 62.0 in | Wt 196.6 lb

## 2019-08-31 DIAGNOSIS — H6122 Impacted cerumen, left ear: Secondary | ICD-10-CM

## 2019-08-31 DIAGNOSIS — I10 Essential (primary) hypertension: Secondary | ICD-10-CM

## 2019-08-31 DIAGNOSIS — Z0000 Encounter for general adult medical examination without abnormal findings: Secondary | ICD-10-CM

## 2019-08-31 DIAGNOSIS — R7303 Prediabetes: Secondary | ICD-10-CM | POA: Diagnosis not present

## 2019-08-31 DIAGNOSIS — I8393 Asymptomatic varicose veins of bilateral lower extremities: Secondary | ICD-10-CM

## 2019-08-31 LAB — POCT UA - MICROALBUMIN
Albumin/Creatinine Ratio, Urine, POC: <30
Creatinine, POC: 200 mg/dL
Microalbumin Ur, POC: 30 mg/L

## 2019-08-31 LAB — POCT URINALYSIS DIPSTICK
Bilirubin, UA: NEGATIVE
Blood, UA: NEGATIVE
Glucose, UA: NEGATIVE
Ketones, UA: NEGATIVE
Leukocytes, UA: NEGATIVE
Nitrite, UA: NEGATIVE
Protein, UA: NEGATIVE
Spec Grav, UA: 1.025 (ref 1.010–1.025)
Urobilinogen, UA: 0.2 E.U./dL
pH, UA: 6.5 (ref 5.0–8.0)

## 2019-08-31 NOTE — Progress Notes (Addendum)
Subjective:     Patient ID: Joanna Powell , female    DOB: 1956-04-08 , 64 y.o.   MRN: [id number]   Chief Complaint  Patient presents with  . Annual Exam   The patient states she uses post menopausal status for birth control.  Negative for Dysmenorrhea and Negative for Menorrhagia Mammogram last done 06/27/2019.  Negative for: breast discharge, breast lump(s), breast pain and breast self exam.  Pertinent negatives include abnormal bleeding (hematology), anxiety, decreased libido, depression, difficulty falling sleep, dyspareunia, history of infertility, nocturia, sexual dysfunction, sleep disturbances, urinary incontinence, urinary urgency, vaginal discharge and vaginal itching. Diet regular.  The patient states her exercise level is minimally.   The patient's tobacco use is:  Social History   Tobacco Use  Smoking Status Former Smoker  Smokeless Tobacco Never Used   She has been exposed to passive smoke. The patient's alcohol use is:  Social History   Substance and Sexual Activity  Alcohol Use Yes   Comment: occasional   Additional information: Last pap 2019 with Dr. Cherly Hensen, next one scheduled for April/May 2020  HPI  Here for HM  Hyperlipidemia - doing well.   She has not had the covid vaccine. Not planning to get the vaccine at this time.   Wt Readings from Last 3 Encounters: 08/31/19 : 196 lb 9.6 oz (89.2 kg) 03/16/19 : 193 lb 12.8 oz (87.9 kg) 08/30/18 : 188 lb 12.8 oz (85.6 kg)   Hypertension This is a chronic problem. The current episode started more than 1 year ago. The problem is controlled. Pertinent negatives include no chest pain, headaches or palpitations. Risk factors for coronary artery disease include dyslipidemia, sedentary lifestyle and obesity. Past treatments include angiotensin blockers. Compliance problems include exercise.  There is no history of angina.      No past medical history on file.   Family History  Problem Relation Age of Onset  . Breast  cancer Sister   . Cancer Mother   . Hypertension Mother   . Hypertension Father   . Heart disease Father      Current Outpatient Medications:  .  atorvastatin (LIPITOR) 10 MG tablet, TAKE 1 TABLET BY MOUTH EVERY DAY, Disp: 90 tablet, Rfl: 1 .  olmesartan (BENICAR) 40 MG tablet, TAKE 1 TABLET EVERY DAY, Disp: 90 tablet, Rfl: 2   No Known Allergies   Review of Systems  Constitutional: Negative.  Negative for fatigue.  HENT: Negative.   Eyes: Negative.   Respiratory: Negative.  Negative for cough.   Cardiovascular: Negative.  Negative for chest pain, palpitations and leg swelling.  Gastrointestinal: Negative.   Endocrine: Negative.   Genitourinary: Negative.   Musculoskeletal: Negative.   Skin: Negative.   Allergic/Immunologic: Negative.   Neurological: Negative.  Negative for dizziness and headaches.  Hematological: Negative.   Psychiatric/Behavioral: Negative.     Today's Vitals   08/31/19 1033  BP: 124/82  Pulse: 70  Temp: 98.6 F (37 C)  TempSrc: Oral  Weight: 196 lb 9.6 oz (89.2 kg)  Height: 5\' 2"  (1.575 m)  PainSc: 0-No pain   Body mass index is 35.96 kg/m.   Objective:  Physical Exam Constitutional:      General: She is not in acute distress.    Appearance: Normal appearance. She is well-developed.  HENT:     Head: Normocephalic and atraumatic.     Right Ear: Hearing normal.     Left Ear: Hearing normal.     Nose: Nose normal.  Mouth/Throat:     Mouth: Mucous membranes are moist.  Eyes:     General: Lids are normal.     Extraocular Movements: Extraocular movements intact.     Conjunctiva/sclera: Conjunctivae normal.     Pupils: Pupils are equal, round, and reactive to light.     Funduscopic exam:    Right eye: No papilledema.        Left eye: No papilledema.  Neck:     Thyroid: No thyroid mass.     Vascular: No carotid bruit.  Cardiovascular:     Rate and Rhythm: Normal rate and regular rhythm.     Pulses: Normal pulses.     Heart sounds:  Normal heart sounds. No murmur.  Pulmonary:     Effort: Pulmonary effort is normal. No respiratory distress.     Breath sounds: Normal breath sounds.  Abdominal:     General: Abdomen is flat. Bowel sounds are normal.     Palpations: Abdomen is soft.  Musculoskeletal:        General: No swelling. Normal range of motion.     Cervical back: Full passive range of motion without pain, normal range of motion and neck supple.     Right lower leg: No edema.     Left lower leg: No edema.  Skin:    General: Skin is warm and dry.     Capillary Refill: Capillary refill takes less than 2 seconds.  Neurological:     General: No focal deficit present.     Mental Status: She is alert and oriented to person, place, and time.     Cranial Nerves: No cranial nerve deficit.     Sensory: No sensory deficit.  Psychiatric:        Mood and Affect: Mood normal.        Behavior: Behavior normal.        Thought Content: Thought content normal.        Judgment: Judgment normal.         Assessment And Plan:      1. Health maintenance examination . Behavior modifications discussed and diet history reviewed.   . Pt will continue to exercise regularly and modify diet with low GI, plant based foods and decrease intake of processed foods.  . Recommend intake of daily multivitamin, Vitamin D, and calcium.  . Recommend mammogram (up to date) for preventive screenings, as well as recommend immunizations that include influenza, TDAP (up to date). She has her PAPs, done with Dr. Garwin Brothers.   - POCT Urinalysis Dipstick (62694) - POCT UA - Microalbumin - Hemoglobin A1c - Lipid panel - CMP14 + Anion Gap - CBC no Diff  2. Essential hypertension . B/P is well controlled.  . CMP ordered to check renal function.  . The importance of regular exercise and dietary modification was stressed to the patient.  . EKG done with sinus bradycardia HR 57   4. Prediabetes  Chronic, stable  No current  medications  Encouraged to limit intake of sugary foods and drinks  Encouraged to increase physical activity to 150 minutes per week  4. Impacted cerumen of left ear  Left ear with soft wax build up, removed with lighted curette  5. Spider veins of both lower extremities  Encouraged to wear support socks.      Minette Brine, FNP

## 2019-08-31 NOTE — Patient Instructions (Signed)
Health Maintenance, Female Adopting a healthy lifestyle and getting preventive care are important in promoting health and wellness. Ask your health care provider about:  The right schedule for you to have regular tests and exams.  Things you can do on your own to prevent diseases and keep yourself healthy. What should I know about diet, weight, and exercise? Eat a healthy diet   Eat a diet that includes plenty of vegetables, fruits, low-fat dairy products, and lean protein.  Do not eat a lot of foods that are high in solid fats, added sugars, or sodium. Maintain a healthy weight Body mass index (BMI) is used to identify weight problems. It estimates body fat based on height and weight. Your health care provider can help determine your BMI and help you achieve or maintain a healthy weight. Get regular exercise Get regular exercise. This is one of the most important things you can do for your health. Most adults should:  Exercise for at least 150 minutes each week. The exercise should increase your heart rate and make you sweat (moderate-intensity exercise).  Do strengthening exercises at least twice a week. This is in addition to the moderate-intensity exercise.  Spend less time sitting. Even light physical activity can be beneficial. Watch cholesterol and blood lipids Have your blood tested for lipids and cholesterol at 64 years of age, then have this test every 5 years. Have your cholesterol levels checked more often if:  Your lipid or cholesterol levels are high.  You are older than 64 years of age.  You are at high risk for heart disease. What should I know about cancer screening? Depending on your health history and family history, you may need to have cancer screening at various ages. This may include screening for:  Breast cancer.  Cervical cancer.  Colorectal cancer.  Skin cancer.  Lung cancer. What should I know about heart disease, diabetes, and high blood  pressure? Blood pressure and heart disease  High blood pressure causes heart disease and increases the risk of stroke. This is more likely to develop in people who have high blood pressure readings, are of African descent, or are overweight.  Have your blood pressure checked: ? Every 3-5 years if you are 18-39 years of age. ? Every year if you are 40 years old or older. Diabetes Have regular diabetes screenings. This checks your fasting blood sugar level. Have the screening done:  Once every three years after age 40 if you are at a normal weight and have a low risk for diabetes.  More often and at a younger age if you are overweight or have a high risk for diabetes. What should I know about preventing infection? Hepatitis B If you have a higher risk for hepatitis B, you should be screened for this virus. Talk with your health care provider to find out if you are at risk for hepatitis B infection. Hepatitis C Testing is recommended for:  Everyone born from 1945 through 1965.  Anyone with known risk factors for hepatitis C. Sexually transmitted infections (STIs)  Get screened for STIs, including gonorrhea and chlamydia, if: ? You are sexually active and are younger than 64 years of age. ? You are older than 64 years of age and your health care provider tells you that you are at risk for this type of infection. ? Your sexual activity has changed since you were last screened, and you are at increased risk for chlamydia or gonorrhea. Ask your health care provider if   you are at risk.  Ask your health care provider about whether you are at high risk for HIV. Your health care provider may recommend a prescription medicine to help prevent HIV infection. If you choose to take medicine to prevent HIV, you should first get tested for HIV. You should then be tested every 3 months for as long as you are taking the medicine. Pregnancy  If you are about to stop having your period (premenopausal) and  you may become pregnant, seek counseling before you get pregnant.  Take 400 to 800 micrograms (mcg) of folic acid every day if you become pregnant.  Ask for birth control (contraception) if you want to prevent pregnancy. Osteoporosis and menopause Osteoporosis is a disease in which the bones lose minerals and strength with aging. This can result in bone fractures. If you are 65 years old or older, or if you are at risk for osteoporosis and fractures, ask your health care provider if you should:  Be screened for bone loss.  Take a calcium or vitamin D supplement to lower your risk of fractures.  Be given hormone replacement therapy (HRT) to treat symptoms of menopause. Follow these instructions at home: Lifestyle  Do not use any products that contain nicotine or tobacco, such as cigarettes, e-cigarettes, and chewing tobacco. If you need help quitting, ask your health care provider.  Do not use street drugs.  Do not share needles.  Ask your health care provider for help if you need support or information about quitting drugs. Alcohol use  Do not drink alcohol if: ? Your health care provider tells you not to drink. ? You are pregnant, may be pregnant, or are planning to become pregnant.  If you drink alcohol: ? Limit how much you use to 0-1 drink a day. ? Limit intake if you are breastfeeding.  Be aware of how much alcohol is in your drink. In the [place] one drink equals one 12 oz bottle of beer (355 mL), one 5 oz glass of wine (148 mL), or one 1 oz glass of hard liquor (44 mL). General instructions  Schedule regular health, dental, and eye exams.  Stay current with your vaccines.  Tell your health care provider if: ? You often feel depressed. ? You have ever been abused or do not feel safe at home. Summary  Adopting a healthy lifestyle and getting preventive care are important in promoting health and wellness.  Follow your health care provider's instructions about healthy  diet, exercising, and getting tested or screened for diseases.  Follow your health care provider's instructions on monitoring your cholesterol and blood pressure. This information is not intended to replace advice given to you by your health care provider. Make sure you discuss any questions you have with your health care provider. Document Revised: 05/11/2018 Document Reviewed: 05/11/2018 Elsevier Patient Education  2020 [employer]  

## 2019-09-01 LAB — CMP14+EGFR
ALT: 18 IU/L (ref 0–32)
AST: 19 IU/L (ref 0–40)
Albumin/Globulin Ratio: 1.6 (ref 1.2–2.2)
Albumin: 4.7 g/dL (ref 3.8–4.8)
Alkaline Phosphatase: 108 IU/L (ref 39–117)
BUN/Creatinine Ratio: 13 (ref 12–28)
BUN: 17 mg/dL (ref 8–27)
Bilirubin Total: 0.6 mg/dL (ref 0.0–1.2)
CO2: 28 mmol/L (ref 20–29)
Calcium: 9.9 mg/dL (ref 8.7–10.3)
Chloride: 103 mmol/L (ref 96–106)
Creatinine, Ser: 1.26 mg/dL — ABNORMAL HIGH (ref 0.57–1.00)
GFR calc Af Amer: 52 mL/min/{1.73_m2} — ABNORMAL LOW (ref >59)
GFR calc non Af Amer: 45 mL/min/{1.73_m2} — ABNORMAL LOW (ref >59)
Globulin, Total: 2.9 g/dL (ref 1.5–4.5)
Glucose: 94 mg/dL (ref 65–99)
Potassium: 4.5 mmol/L (ref 3.5–5.2)
Sodium: 141 mmol/L (ref 134–144)
Total Protein: 7.6 g/dL (ref 6.0–8.5)

## 2019-09-01 LAB — HEMOGLOBIN A1C
Est. average glucose Bld gHb Est-mCnc: 123 mg/dL
Hgb A1c MFr Bld: 5.9 % — ABNORMAL HIGH (ref 4.8–5.6)

## 2019-09-01 LAB — CBC
Hematocrit: 42.8 % (ref 34.0–46.6)
Hemoglobin: 13.9 g/dL (ref 11.1–15.9)
MCH: 26.2 pg — ABNORMAL LOW (ref 26.6–33.0)
MCHC: 32.5 g/dL (ref 31.5–35.7)
MCV: 81 fL (ref 79–97)
Platelets: 394 10*3/uL (ref 150–450)
RBC: 5.3 x10E6/uL — ABNORMAL HIGH (ref 3.77–5.28)
RDW: 13.8 % (ref 11.7–15.4)
WBC: 5.2 10*3/uL (ref 3.4–10.8)

## 2019-09-01 LAB — LIPID PANEL
Chol/HDL Ratio: 3.3 ratio (ref 0.0–4.4)
Cholesterol, Total: 186 mg/dL (ref 100–199)
HDL: 56 mg/dL (ref >39)
LDL Chol Calc (NIH): 111 mg/dL — ABNORMAL HIGH (ref 0–99)
Triglycerides: 106 mg/dL (ref 0–149)
VLDL Cholesterol Cal: 19 mg/dL (ref 5–40)

## 2019-09-01 LAB — VITAMIN D 25 HYDROXY (VIT D DEFICIENCY, FRACTURES): Vit D, 25-Hydroxy: 20 ng/mL — ABNORMAL LOW (ref 30.0–100.0)

## 2019-10-12 ENCOUNTER — Other Ambulatory Visit: Payer: Self-pay | Admitting: Nurse Practitioner

## 2020-01-18 ENCOUNTER — Other Ambulatory Visit: Payer: Self-pay | Admitting: Nurse Practitioner

## 2020-01-24 ENCOUNTER — Ambulatory Visit (INDEPENDENT_AMBULATORY_CARE_PROVIDER_SITE_OTHER): Payer: BC Managed Care – PPO | Admitting: Cardiovascular Disease

## 2020-01-24 ENCOUNTER — Other Ambulatory Visit: Payer: Self-pay

## 2020-01-24 ENCOUNTER — Encounter: Payer: Self-pay | Admitting: Cardiovascular Disease

## 2020-01-24 VITALS — BP 152/100 | HR 61 | Ht 62.0 in | Wt 201.0 lb

## 2020-01-24 DIAGNOSIS — E7800 Pure hypercholesterolemia, unspecified: Secondary | ICD-10-CM | POA: Diagnosis not present

## 2020-01-24 DIAGNOSIS — I10 Essential (primary) hypertension: Secondary | ICD-10-CM | POA: Diagnosis not present

## 2020-01-24 HISTORY — DX: Pure hypercholesterolemia, unspecified: E78.00

## 2020-01-24 MED ORDER — AMLODIPINE BESYLATE 5 MG PO TABS
5.0000 mg | ORAL_TABLET | Freq: Every day | ORAL | 3 refills | Status: DC
Start: 2020-01-24 — End: 2020-01-24

## 2020-01-24 MED ORDER — AMLODIPINE BESYLATE 5 MG PO TABS
5.0000 mg | ORAL_TABLET | Freq: Every day | ORAL | 3 refills | Status: DC
Start: 2020-01-24 — End: 2020-02-22

## 2020-01-24 NOTE — Patient Instructions (Signed)
Medication Instructions:  START AMLODIPINE 5 MG DAILY   *If you need a refill on your cardiac medications before your next appointment, please call your pharmacy*   Lab Work: NONE   Testing/Procedures: NONE   Follow-Up: At [facility], you and your health needs are our priority.  As part of our continuing mission to provide you with exceptional heart care, we have created designated Provider Care Teams.  These Care Teams include your primary Cardiologist (physician) and Advanced Practice Providers (APPs -  Physician Assistants and Nurse Practitioners) who all work together to provide you with the care you need, when you need it.  We recommend signing up for the patient portal called "MyChart".  Sign up information is provided on this After Visit Summary.  MyChart is used to connect with patients for Virtual Visits (Telemedicine).  Patients are able to view lab/test results, encounter notes, upcoming appointments, etc.  Non-urgent messages can be sent to your provider as well.   To learn more about what you can do with MyChart, go to [email or web address].    Your next appointment:   3 month(s) WITH DR [facility] IN PERSON   Your physician recommends that you schedule a follow-up appointment in: 1 MONTH WITH PHARM D FOR BLOOD PRESSURE  Other Instructions  MONITOR YOUR BLOOD PRESSURE DAILY, BRING READINGS AND MACHINE TO THE 1 MONTH FOLLOW UP WITH PHARM D

## 2020-01-24 NOTE — Progress Notes (Signed)
Cardiology Office Note   Date:  01/24/2020   ID:  Joanna Powell, DOB 08-16-1955, MRN [id number]  PCP:  Arnette Felts, FNP  Cardiologist:   Chilton Si, MD   No chief complaint on file.    History of Present Illness: Joanna Powell is a 64 y.o. female with hypertension, hyperlipidemia and prior tobacco abuse who is being seen today for the evaluation of hypertension at the request of Maxie Better, MD.  She last saw Arnette Felts, FNP in 08/2019 and her BP was 121/82.  She then saw Dr. Cherly Hensen on 10/2019 and her blood pressure was 160/98.  She was initially diagnosed with HTN may years ago.  She was initially on valsartan and switched to olmesartan due to the recall.  It has been difficult to control at times.  She did take another medication initially but is unsure what it was.  She does not check her blood pressure at home.  She does note that she feels woozy when it is very high.  She has not been exercising much lately which she attributes to knee issues.  She is planning to get a bike and likes to walk for exercise.  In the past she did Zumba but is limited by her knee.  She has no exertional chest pain or shortness of breath.  She does get some swelling in her knees which she attributes to arthritis and a torn meniscus.  She has no orthopnea or PND.  She notes that she has some palpitations at times when laying in bed.  It last for a few seconds and happens less than once per month.  She feels like her heart pounds and then has a fluttering for a couple seconds.  There is no associated lightheadedness or dizziness.  It never occurs with exertion.  She drinks 1 cup of coffee daily and has no other caffeine.  She does not use NSAIDs or over-the-counter cold or cough medications.  She does not use any supplements or herbs.  She drinks up to 2 glasses of wine occasionally.  She tries to do most of her cooking at home and does not add much salt.  She does eat a lot of frozen dinner meals because  she is busy with work.    Past Medical History:  Diagnosis Date  . Pure hypercholesterolemia 01/24/2020    No past surgical history on file.   Current Outpatient Medications  Medication Sig Dispense Refill  . atorvastatin (LIPITOR) 10 MG tablet TAKE 1 TABLET BY MOUTH EVERY DAY 90 tablet 1  . olmesartan (BENICAR) 40 MG tablet TAKE 1 TABLET EVERY DAY 90 tablet 2  . amLODipine (NORVASC) 5 MG tablet Take 1 tablet (5 mg total) by mouth daily. 90 tablet 3   No current facility-administered medications for this visit.    Allergies:   Patient has no known allergies.    Social History:  The patient  reports that she has quit smoking. She has never used smokeless tobacco. She reports current alcohol use. She reports that she does not use drugs.   Family History:  The patient's family history includes Breast cancer in her sister; CAD in her brother and mother; Cancer in her mother; Heart disease in her father; Hypertension in her father and mother.    ROS:  Please see the history of present illness.   Otherwise, review of systems are positive for none.   All other systems are reviewed and negative.    PHYSICAL EXAM: VS:  BP (!) 152/100   Pulse 61   Ht 5\' 2"  (1.575 m)   Wt 201 lb (91.2 kg)   SpO2 98%   BMI 36.76 kg/m  , BMI Body mass index is 36.76 kg/m. GENERAL:  Well appearing HEENT:  Pupils equal round and reactive, fundi not visualized, oral mucosa unremarkable NECK:  No jugular venous distention, waveform within normal limits, carotid upstroke brisk and symmetric, no bruits LUNGS:  Clear to auscultation bilaterally HEART:  RRR.  PMI not displaced or sustained,S1 and S2 within normal limits, no S3, no S4, no clicks, no rubs, no murmurs ABD:  Flat, positive bowel sounds normal in frequency in pitch, no bruits, no rebound, no guarding, no midline pulsatile mass, no hepatomegaly, no splenomegaly EXT:  2 plus pulses throughout, no edema, no cyanosis no clubbing SKIN:  No rashes no  nodules NEURO:  Cranial nerves II through XII grossly intact, motor grossly intact throughout PSYCH:  Cognitively intact, oriented to person place and time   EKG:  EKG is ordered today. The ekg ordered today demonstrates sinus rhythm.  Rate 61 bpm.  LVH.   Recent Labs: 03/16/2019: TSH 1.260 08/31/2019: ALT 18; BUN 17; Creatinine, Ser 1.26; Hemoglobin 13.9; Platelets 394; Potassium 4.5; Sodium 141    Lipid Panel    Component Value Date/Time   CHOL 186 08/31/2019 1130   TRIG 106 08/31/2019 1130   HDL 56 08/31/2019 1130   CHOLHDL 3.3 08/31/2019 1130   LDLCALC 111 (H) 08/31/2019 1130      Wt Readings from Last 3 Encounters:  01/24/20 201 lb (91.2 kg)  08/31/19 196 lb 9.6 oz (89.2 kg)  03/16/19 193 lb 12.8 oz (87.9 kg)      ASSESSMENT AND PLAN:  # Essential hypertension:  Blood pressure is poorly controlled both initially and on repeat.  It has been high at home and was elevated with Dr. 03/18/19.  We did discuss the fact that her blood pressure was better controlled when her weight was lower.  She is going to work intensively on diet and exercise.  We also discussed limiting sodium intake to 1500 mg consistent with the DASH diet.  We will add amlodipine 5 mg daily to her olmesartan.  She will check her blood pressure at home and bring to follow-up.  # Palpitations: Symptoms sound like PACs or PVCs.  They never occur with exertion and there are no warning signs.  The frequency is less than once per month so we will not get an ambulatory monitor.  She already had thyroid function, CBC, and CMP which were unremarkable.  # Hyperlipidemia : Continue atorvastatin.  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   Current medicines are reviewed at length with the patient today.  The patient does not have concerns regarding medicines.  The following changes have been made:  no change  Labs/ tests ordered today include:   Orders Placed This Encounter  Procedures  . AMB Referral to [facility]  Pharm-D  . EKG 12-Lead     Disposition:   FU with Chayna Surratt C. [facility], MD, [facility] in 3 months.  PharmD in 1 month.    Signed, Jackqueline Aquilar C. [facility], MD, [facility]  01/24/2020 1:59 PM    [facility]

## 2020-02-05 ENCOUNTER — Encounter: Payer: Self-pay | Admitting: Nurse Practitioner

## 2020-02-08 ENCOUNTER — Encounter: Payer: Self-pay | Admitting: Nurse Practitioner

## 2020-02-08 ENCOUNTER — Ambulatory Visit: Payer: BC Managed Care – PPO | Admitting: Nurse Practitioner

## 2020-02-08 ENCOUNTER — Other Ambulatory Visit: Payer: Self-pay

## 2020-02-08 VITALS — BP 146/88 | HR 57 | Temp 98.1°F | Ht 62.0 in | Wt 199.0 lb

## 2020-02-08 DIAGNOSIS — I10 Essential (primary) hypertension: Secondary | ICD-10-CM

## 2020-02-08 DIAGNOSIS — E7800 Pure hypercholesterolemia, unspecified: Secondary | ICD-10-CM | POA: Diagnosis not present

## 2020-02-08 DIAGNOSIS — R300 Dysuria: Secondary | ICD-10-CM | POA: Diagnosis not present

## 2020-02-08 DIAGNOSIS — R7303 Prediabetes: Secondary | ICD-10-CM

## 2020-02-08 DIAGNOSIS — E559 Vitamin D deficiency, unspecified: Secondary | ICD-10-CM

## 2020-02-08 LAB — POCT URINALYSIS DIPSTICK
Bilirubin, UA: NEGATIVE
Glucose, UA: NEGATIVE
Ketones, UA: NEGATIVE
Nitrite, UA: NEGATIVE
Protein, UA: POSITIVE — AB
Spec Grav, UA: >=1.03 — AB (ref 1.010–1.025)
Urobilinogen, UA: 0.2 E.U./dL
pH, UA: 5.5 (ref 5.0–8.0)

## 2020-02-08 MED ORDER — NITROFURANTOIN MONOHYD MACRO 100 MG PO CAPS: 100.0000 mg | ORAL_CAPSULE | Freq: Two times a day (BID) | ORAL | 0 refills | Status: AC

## 2020-02-08 NOTE — Progress Notes (Signed)
Rutherford Nail as a scribe for Minette Brine, FNP.,have documented all relevant documentation on the behalf of Minette Brine, FNP,as directed by  Minette Brine, FNP while in the presence of Minette Brine, [place]. This visit occurred during the SARS-CoV-2 public health emergency.  Safety protocols were in place, including screening questions prior to the visit, additional usage of staff PPE, and extensive cleaning of exam room while observing appropriate contact time as indicated for disinfecting solutions.  Subjective:     Patient ID: Joanna Powell , female    DOB: 1955/12/05 , 64 y.o.   MRN: [id number]   Chief Complaint  Patient presents with  . Urinary Tract Infection    HPI  Pt presents today for UTI  Wt Readings from Last 3 Encounters: 02/08/20 : 199 lb (90.3 kg) 01/24/20 : 201 lb (91.2 kg) 08/31/19 : 196 lb 9.6 oz (89.2 kg)  She stopped taking her amlodipine due to possible cause of her urinary symptoms.  Dysuria  This is a new problem. The current episode started in the past 7 days. The problem occurs every urination. The problem has been unchanged. The quality of the pain is described as burning. There has been no fever. She is sexually active. There is no history of pyelonephritis. Associated symptoms include chills and frequency. Pertinent negatives include no flank pain, hesitancy or urgency. She has tried increased fluids for the symptoms. Her past medical history is significant for recurrent UTIs (has had one in the past).     Past Medical History:  Diagnosis Date  . Pure hypercholesterolemia 01/24/2020     Family History  Problem Relation Age of Onset  . Breast cancer Sister   . Cancer Mother   . Hypertension Mother   . CAD Mother   . Hypertension Father   . Heart disease Father   . CAD Brother      Current Outpatient Medications:  .  amLODipine (NORVASC) 5 MG tablet, Take 1 tablet (5 mg total) by mouth daily., Disp: 90 tablet, Rfl: 3 .  atorvastatin  (LIPITOR) 10 MG tablet, TAKE 1 TABLET BY MOUTH EVERY DAY, Disp: 90 tablet, Rfl: 1 .  olmesartan (BENICAR) 40 MG tablet, TAKE 1 TABLET EVERY DAY, Disp: 90 tablet, Rfl: 2 .  nitrofurantoin, macrocrystal-monohydrate, (MACROBID) 100 MG capsule, Take 1 capsule (100 mg total) by mouth 2 (two) times daily for 5 days., Disp: 10 capsule, Rfl: 0   No Known Allergies   Review of Systems  Constitutional: Positive for chills.  HENT: Negative.   Respiratory: Negative.   Endocrine: Negative.   Genitourinary: Positive for dysuria and frequency. Negative for flank pain, hesitancy and urgency.  Musculoskeletal: Negative.   Skin: Negative.   Neurological: Negative.   Psychiatric/Behavioral: Negative.      Today's Vitals   02/08/20 0859 02/08/20 0902  BP: 122/84 (!) 146/88  Pulse: (!) 57   Temp: 98.1 F (36.7 C)   TempSrc: Oral   Weight: 199 lb (90.3 kg)   Height: 5' 2" (1.575 m)   PainSc: 0-No pain    Body mass index is 36.4 kg/m.   Objective:  Physical Exam Constitutional:      Appearance: Normal appearance.  Cardiovascular:     Rate and Rhythm: Normal rate and regular rhythm.     Pulses: Normal pulses.     Heart sounds: Normal heart sounds. No murmur heard.   Pulmonary:     Effort: Pulmonary effort is normal. No respiratory distress.     Breath sounds: Normal  breath sounds.  Neurological:     General: No focal deficit present.     Mental Status: She is alert and oriented to person, place, and time.  Psychiatric:        Mood and Affect: Mood normal.        Behavior: Behavior normal.        Thought Content: Thought content normal.        Judgment: Judgment normal.         Assessment And Plan:     1. Dysuria  Urinalysis is negative for nitrates has trace blood and small leukocytes. Due to having urinary symptoms.  She is encouragedd to increase her water intake. She felt this came on due to taking the Amlodipine which she has stopped.  - POCT Urinalysis Dipstick (81002) -  Culture, Urine - nitrofurantoin, macrocrystal-monohydrate, (MACROBID) 100 MG capsule; Take 1 capsule (100 mg total) by mouth 2 (two) times daily for 5 days.  Dispense: 10 capsule; Refill: 0  2. Essential hypertension  Chronic, blood pressure is slightly elevated today. She has not been taking her amlodipine started by Dr. Oval Linsey due to feeling like it caused her urinary symptoms.  I advised her to contact Dr. Oval Linsey to make her aware she has stopped taking the medication - CMP14+EGFR  3. Prediabetes  Chronic, stable.  Continue with focusing on healthy diet - CMP14+EGFR - Hemoglobin A1c  4. Elevated LDL cholesterol level  Chronic, stable  Continue with current medications, tolerating well.  Encouraged to continue with low fat diet - Lipid panel  5. Vitamin D deficiency  Will check vitamin D level and supplement as needed.     Also encouraged to spend 15 minutes in the sun daily.  - VITAMIN D 25 Hydroxy (Vit-D Deficiency, Fractures)   Requested records from Dr. Garwin Brothers for her most recent PAP  Patient was given opportunity to ask questions. Patient verbalized understanding of the plan and was able to repeat key elements of the plan. All questions were answered to their satisfaction.  Minette Brine, FNP   I, Minette Brine, FNP, have reviewed all documentation for this visit. The documentation on 02/08/20 for the exam, diagnosis, procedures, and orders are all accurate and complete.  THE PATIENT IS ENCOURAGED TO PRACTICE SOCIAL DISTANCING DUE TO THE COVID-19 PANDEMIC.

## 2020-02-09 LAB — CMP14+EGFR
ALT: 20 IU/L (ref 0–32)
AST: 20 IU/L (ref 0–40)
Albumin/Globulin Ratio: 1.8 (ref 1.2–2.2)
Albumin: 4.5 g/dL (ref 3.8–4.8)
Alkaline Phosphatase: 110 IU/L (ref 48–121)
BUN/Creatinine Ratio: 14 (ref 12–28)
BUN: 18 mg/dL (ref 8–27)
Bilirubin Total: 0.5 mg/dL (ref 0.0–1.2)
CO2: 25 mmol/L (ref 20–29)
Calcium: 9.9 mg/dL (ref 8.7–10.3)
Chloride: 103 mmol/L (ref 96–106)
Creatinine, Ser: 1.27 mg/dL — ABNORMAL HIGH (ref 0.57–1.00)
GFR calc Af Amer: 52 mL/min/{1.73_m2} — ABNORMAL LOW (ref >59)
GFR calc non Af Amer: 45 mL/min/{1.73_m2} — ABNORMAL LOW (ref >59)
Globulin, Total: 2.5 g/dL (ref 1.5–4.5)
Glucose: 99 mg/dL (ref 65–99)
Potassium: 4.7 mmol/L (ref 3.5–5.2)
Sodium: 140 mmol/L (ref 134–144)
Total Protein: 7 g/dL (ref 6.0–8.5)

## 2020-02-09 LAB — LIPID PANEL
Chol/HDL Ratio: 3.5 ratio (ref 0.0–4.4)
Cholesterol, Total: 176 mg/dL (ref 100–199)
HDL: 50 mg/dL (ref >39)
LDL Chol Calc (NIH): 110 mg/dL — ABNORMAL HIGH (ref 0–99)
Triglycerides: 86 mg/dL (ref 0–149)
VLDL Cholesterol Cal: 16 mg/dL (ref 5–40)

## 2020-02-09 LAB — HEMOGLOBIN A1C
Est. average glucose Bld gHb Est-mCnc: 123 mg/dL
Hgb A1c MFr Bld: 5.9 % — ABNORMAL HIGH (ref 4.8–5.6)

## 2020-02-09 LAB — VITAMIN D 25 HYDROXY (VIT D DEFICIENCY, FRACTURES): Vit D, 25-Hydroxy: 17 ng/mL — ABNORMAL LOW (ref 30.0–100.0)

## 2020-02-11 LAB — URINE CULTURE

## 2020-02-13 ENCOUNTER — Other Ambulatory Visit: Payer: Self-pay

## 2020-02-13 DIAGNOSIS — N1831 Chronic kidney disease, stage 3a: Secondary | ICD-10-CM

## 2020-02-14 ENCOUNTER — Encounter: Payer: Self-pay | Admitting: Nurse Practitioner

## 2020-02-14 LAB — HM COLONOSCOPY

## 2020-02-20 ENCOUNTER — Encounter: Payer: Self-pay | Admitting: Nurse Practitioner

## 2020-02-22 ENCOUNTER — Other Ambulatory Visit: Payer: Self-pay

## 2020-02-22 ENCOUNTER — Ambulatory Visit (INDEPENDENT_AMBULATORY_CARE_PROVIDER_SITE_OTHER): Payer: BC Managed Care – PPO | Admitting: Pharmacist

## 2020-02-22 VITALS — BP 142/88 | HR 61 | Temp 97.7°F | Resp 14 | Ht 62.0 in | Wt 201.0 lb

## 2020-02-22 DIAGNOSIS — I10 Essential (primary) hypertension: Secondary | ICD-10-CM

## 2020-02-22 MED ORDER — AMLODIPINE BESYLATE 5 MG PO TABS
10.0000 mg | ORAL_TABLET | Freq: Every day | ORAL | 0 refills | Status: DC
Start: 2020-02-22 — End: 2020-03-21

## 2020-02-22 NOTE — Patient Instructions (Addendum)
Return for a  follow up appointment in 4 weeks  Check your blood pressure at home daily (if able) and keep record of the readings.  Take your BP meds as follows: *INCREASE amlodipine to 10mg  (okay to take 2 tablets of 5mg  each) at bedtime* *CONTINUE TAKING olmesartan 40mg  every morning  Bring all of your meds, your BP cuff and your record of home blood pressures to your next appointment.  Exercise as you're able, try to walk approximately 30 minutes per day.  Keep salt intake to a minimum, especially watch canned and prepared boxed foods.  Eat more fresh fruits and vegetables and fewer canned items.  Avoid eating in fast food restaurants.    HOW TO TAKE YOUR BLOOD PRESSURE: . Rest 5 minutes before taking your blood pressure. .  Don't smoke or drink caffeinated beverages for at least 30 minutes before. . Take your blood pressure before (not after) you eat. . Sit comfortably with your back supported and both feet on the floor (don't cross your legs). . Elevate your arm to heart level on a table or a desk. . Use the proper sized cuff. It should fit smoothly and snugly around your bare upper arm. There should be enough room to slip a fingertip under the cuff. The bottom edge of the cuff should be 1 inch above the crease of the elbow. . Ideally, take 3 measurements at one sitting and record the average.

## 2020-02-22 NOTE — Progress Notes (Signed)
Patient ID: Joanna Powell                 DOB: [birth year]                      MRN: [id number]     HPI: Joanna Powell is a 64 y.o. female referred by Dr. Duke Salvia to HTN clinic. PMH includes hyperlipidemia, and hypertension. She presents for initial evaluation and denies chest pain, SOB, headaches, dizziness, or blurry vision. Reports compliance with current therapy and denies adverse reaction to current medication. She is working on diet and exercise.    Current HTN meds:  Amlodipine 5mg  daily (morning) Olmesartan 40mg  daily (morning)  Previously tried:  Valsartan - changed to olmesartan d/t recall  BP goal: 130/80  Family History: The patient's family history includes Breast cancer in her sister; CAD in her brother and mother; Cancer in her mother; Heart disease in her father; Hypertension in her father and mother  Social History: occasional cup of wine, former smoker quite 1997. 1 cup of coffee daily  Diet: 75/25 mostly home cooked meals,fried sea food , no , occasional pizza or food.   Exercise:recumbant bike  Home BP readings:  23 readings; average 149/86 (range 122-163/72-95) Omron  Arm BP cuff (accurate within [place] vs manual reading)  Wt Readings from Last 3 Encounters:  02/22/20 201 lb (91.2 kg)  02/08/20 199 lb (90.3 kg)  01/24/20 201 lb (91.2 kg)   BP Readings from Last 3 Encounters:  02/22/20 (!) 142/88  02/08/20 (!) 146/88  01/24/20 (!) 152/100   Pulse Readings from Last 3 Encounters:  02/22/20 61  02/08/20 (!) 57  01/24/20 61    Renal function: Estimated Creatinine Clearance: 47 mL/min (A) (by C-G formula based on SCr of 1.27 mg/dL (H)).  Past Medical History:  Diagnosis Date  . Pure hypercholesterolemia 01/24/2020    Current Outpatient Medications on File Prior to Visit  Medication Sig Dispense Refill  . atorvastatin (LIPITOR) 10 MG tablet TAKE 1 TABLET BY MOUTH EVERY DAY 90 tablet 1  . olmesartan (BENICAR) 40 MG tablet TAKE 1 TABLET  EVERY DAY 90 tablet 2   No current facility-administered medications on file prior to visit.    No Known Allergies  Blood pressure (!) 142/88, pulse 61, temperature 97.7 F (36.5 C), resp. rate 14, height 5\' 2"  (1.575 m), weight 201 lb (91.2 kg), SpO2 97 %.  Essential hypertension Blood pressure remains above goa, but improved since adding amlodipine to BP management. Patient is currently working on improving dietary sodium and increasing physical activity. Will increase amlodipine to 10mg  daily and continue olmesartan 40mg  daily. Plan to change therapy to Azor 10-40mg  daily during nextt office visit to simplify therapy (if patient tolerates increased amlodipine dose). May consider Tribenzor if additional BP control needed.   Aksh Swart Rodriguez-Guzman PharmD, BCPS, CPP [facility] [address] [place] 02/28/2020 6:25 PM

## 2020-02-28 ENCOUNTER — Encounter: Payer: Self-pay | Admitting: Pharmacist

## 2020-02-28 NOTE — Assessment & Plan Note (Signed)
Blood pressure remains above goa, but improved since adding amlodipine to BP management. Patient is currently working on improving dietary sodium and increasing physical activity. Will increase amlodipine to 10mg  daily and continue olmesartan 40mg  daily. Plan to change therapy to Azor 10-40mg  daily during nextt office visit to simplify therapy (if patient tolerates increased amlodipine dose). May consider Tribenzor if additional BP control needed.

## 2020-03-07 ENCOUNTER — Ambulatory Visit: Payer: BC Managed Care – PPO | Admitting: Nurse Practitioner

## 2020-03-15 ENCOUNTER — Encounter: Payer: Self-pay | Admitting: Nurse Practitioner

## 2020-03-18 NOTE — Telephone Encounter (Signed)
See when she had the appt if has been more than 14 days she can be seen in building the front room or my sick room

## 2020-03-21 ENCOUNTER — Other Ambulatory Visit: Payer: Self-pay

## 2020-03-21 ENCOUNTER — Ambulatory Visit (INDEPENDENT_AMBULATORY_CARE_PROVIDER_SITE_OTHER): Payer: BC Managed Care – PPO | Admitting: Pharmacist

## 2020-03-21 VITALS — BP 122/78 | HR 61 | Resp 14 | Ht 62.0 in | Wt 199.0 lb

## 2020-03-21 DIAGNOSIS — I10 Essential (primary) hypertension: Secondary | ICD-10-CM | POA: Diagnosis not present

## 2020-03-21 MED ORDER — AMLODIPINE BESYLATE 10 MG PO TABS
10.0000 mg | ORAL_TABLET | Freq: Every day | ORAL | 1 refills | Status: DC
Start: 2020-03-21 — End: 2020-04-22

## 2020-03-21 NOTE — Patient Instructions (Signed)
Return for a follow up appointment in 4 weeks  Check your blood pressure at home daily (if able) and keep record of the readings.  Take your BP meds as follows: *NO CHANGE*  Bring all of your meds, your BP cuff and your record of home blood pressures to your next appointment.  Exercise as you're able, try to walk approximately 30 minutes per day.  Keep salt intake to a minimum, especially watch canned and prepared boxed foods.  Eat more fresh fruits and vegetables and fewer canned items.  Avoid eating in fast food restaurants.    HOW TO TAKE YOUR BLOOD PRESSURE: . Rest 5 minutes before taking your blood pressure. .  Don't smoke or drink caffeinated beverages for at least 30 minutes before. . Take your blood pressure before (not after) you eat. . Sit comfortably with your back supported and both feet on the floor (don't cross your legs). . Elevate your arm to heart level on a table or a desk. . Use the proper sized cuff. It should fit smoothly and snugly around your bare upper arm. There should be enough room to slip a fingertip under the cuff. The bottom edge of the cuff should be 1 inch above the crease of the elbow. . Ideally, take 3 measurements at one sitting and record the average.

## 2020-03-21 NOTE — Progress Notes (Signed)
Patient ID: Joanna Powell                 DOB: 12-Aug-1955                      MRN: [id number]     HPI: Joanna Powell is a 64 y.o. female referred by Dr. Duke Salvia to HTN clinic. PMH includes hyperlipidemia, and hypertension. She presents for follow up and denies chest pain, SOB, headaches, dizziness, or blurry vision. Reports compliance with current therapy and denies adverse reaction to current medication. She is working on diet and exercise.   Recently complaining about pressure on front side of the head consistent with sinus pressure symptoms. All symptoms are resolved and had recent follow up with nephrologist f/u for elevated Scr  Current HTN meds:  Amlodipine 10mg  daily (evening) Olmesartan 40mg  daily (morning)  Previously tried:  Valsartan - changed to olmesartan d/t recall  BP goal: 130/80  Family History: The patient's family history includes Breast cancer in her sister; CAD in her brother and mother; Cancer in her mother; Heart disease in her father; Hypertension in her father and mother  Social History: occasional cup of wine, former smoker quite 1997. 1 cup of coffee daily  Diet: 75/25 mostly home cooked meals,fried sea food , no , occasional pizza or food.   Exercise:recumbant bike  Home BP readings:  Omron  Arm BP cuff (accurate within [place] vs manual reading) 23 readings, average 120/69  Wt Readings from Last 3 Encounters:  03/21/20 199 lb (90.3 kg)  02/22/20 201 lb (91.2 kg)  02/08/20 199 lb (90.3 kg)   BP Readings from Last 3 Encounters:  03/21/20 122/78  02/22/20 (!) 142/88  02/08/20 (!) 146/88   Pulse Readings from Last 3 Encounters:  03/21/20 61  02/22/20 61  02/08/20 (!) 57    Past Medical History:  Diagnosis Date  . Pure hypercholesterolemia 01/24/2020    Current Outpatient Medications on File Prior to Visit  Medication Sig Dispense Refill  . atorvastatin (LIPITOR) 10 MG tablet TAKE 1 TABLET BY MOUTH EVERY DAY 90 tablet 1  .  olmesartan (BENICAR) 40 MG tablet TAKE 1 TABLET EVERY DAY 90 tablet 2   No current facility-administered medications on file prior to visit.    No Known Allergies  Blood pressure 122/78, pulse 61, resp. rate 14, height 5\' 2"  (1.575 m), weight 199 lb (90.3 kg), SpO2 97 %.  Essential hypertension Blood pressure current;ly at goal, renal function stable and follow by nephrologist, and head"pressure" completely resolved. Will continue current therapy without changes, and follow up in 4 week.   Joanna Powell PharmD, BCPS, CPP [facility] [address] [place] [facility] 03/31/2020 7:16 PM

## 2020-03-31 ENCOUNTER — Encounter: Payer: Self-pay | Admitting: Pharmacist

## 2020-03-31 NOTE — Assessment & Plan Note (Signed)
Blood pressure current;ly at goal, renal function stable and follow by nephrologist, and head"pressure" completely resolved. Will continue current therapy without changes, and follow up in 4 week.

## 2020-04-17 ENCOUNTER — Other Ambulatory Visit: Payer: Self-pay | Admitting: Nurse Practitioner

## 2020-04-18 ENCOUNTER — Ambulatory Visit (INDEPENDENT_AMBULATORY_CARE_PROVIDER_SITE_OTHER): Payer: BC Managed Care – PPO | Admitting: Pharmacist Clinician (PhC)/ Clinical Pharmacy Specialist

## 2020-04-18 VITALS — BP 128/76 | HR 62 | Ht 62.0 in | Wt 198.6 lb

## 2020-04-18 DIAGNOSIS — I10 Essential (primary) hypertension: Secondary | ICD-10-CM

## 2020-04-18 MED ORDER — OLMESARTAN MEDOXOMIL 40 MG PO TABS
40.0000 mg | ORAL_TABLET | Freq: Every day | ORAL | 2 refills | Status: DC
Start: 2020-04-18 — End: 2020-04-22

## 2020-04-18 NOTE — Patient Instructions (Addendum)
Return for a a follow up appointment with Dr. Duke Salvia on January 5 at 8:30  Check your blood pressure at home 3-4 times per week, and keep record of the readings.  Take your BP meds as follows:  No changes to medications at this time.  Bring all of your meds, your BP cuff and your record of home blood pressures to your next appointment.  Exercise as you're able, try to walk approximately 30 minutes per day.  Keep salt intake to a minimum, especially watch canned and prepared boxed foods.  Eat more fresh fruits and vegetables and fewer canned items.  Avoid eating in fast food restaurants.    HOW TO TAKE YOUR BLOOD PRESSURE: . Rest 5 minutes before taking your blood pressure. .  Don't smoke or drink caffeinated beverages for at least 30 minutes before. . Take your blood pressure before (not after) you eat. . Sit comfortably with your back supported and both feet on the floor (don't cross your legs). . Elevate your arm to heart level on a table or a desk. . Use the proper sized cuff. It should fit smoothly and snugly around your bare upper arm. There should be enough room to slip a fingertip under the cuff. The bottom edge of the cuff should be 1 inch above the crease of the elbow. . Ideally, take 3 measurements at one sitting and record the average.

## 2020-04-18 NOTE — Progress Notes (Signed)
04/18/20  HPI: Joanna Powell is a 64 yo female with a PMH of hypertension and hyperlipidemia (on atorvastatin 10 mg qd) who presents today for a follow up visit with the hypertension clinic. At her initial visit with Dr. Oval Linsey, she had a BP of 152/100 mmHg and was only on olmesartan at the time. Since then, amlodipine has been added and increased to max dose of 10 mg. She has been working on lifestyle modifications to increase physical activity and decrease sodium intake in her diet. Today is her 3rd follow up visit with the hypertension clinic. She says everything has been going well and she has not had any symptoms of dizziness or lightheadedness.   BP goal: <130/80 mmHg  Vitals  04/18/20: 128/76 mmHg, pulse 62  03/21/20: 122/78 mmHg, pulse 61  02/22/20: 142/88 mmHg, pulse 61  Current HTN Medications: olmesartan 40 mg qam, amlodipine 10 mg qpm  Previously Tried: valsartan (changed to olmesartan due to recall)  Family Hx: Mother has CAD, HTN, and cancer. Father has HTN and heart disease. Brother has CAD  Social Hx: Negative for smoking. Occasionally drinks a glass of wine  Diet: Cooks meals at home most of the time without adding salt. Tries to limit the amount of pork and beef she eats and instead eats mostly fish and vegetables. Drinks one cup of coffee every morning.   Physical activity: When it is warmer out, tries to go on a bike ride 1-2 times per week. Occasionally goes on a walk.   Home BP Readings: Patient has omron arm BP cuff. She brought it to appt today and BP was 134/74 mmHg. This was within 10 mmHg of manual BP reading of 128/76 mmHg.   Avg morning: 124/73 mmHg (106-138/62-85)  Avg Evening: 125/71 mmHg (105-145/62-81)  Labs: 02/08/20 Na 140, K 4.7, SCr 1.27, BUN 18, eGFR 52  Assessment/Plan: BP is controlled on olmesartan and amlodipine. She is currently at a goal BP of <130/80 mmHg. Discussed further lifestyle modifications of increasing physical activity to 150 min/week and  continuing to eat vegetables, whole grains, and limiting salt intake. Continue current medications as previously prescribed. Will continue to monitor BP at home 3-4 times per week and bring log with her to next appointment. Patient will follow up with Dr. Oval Linsey in January.   Higinio Plan, PharmD Candidate [employer] Class of 2022  I was with student and patient for entire interview and agree with the above assessment and plan  Tommy Medal PharmD CPP [place] at [facility]

## 2020-04-22 MED ORDER — AMLODIPINE-OLMESARTAN 10-40 MG PO TABS: 1.0000 | ORAL_TABLET | Freq: Every day | ORAL | 3 refills | Status: DC

## 2020-05-01 ENCOUNTER — Ambulatory Visit: Payer: BC Managed Care – PPO | Admitting: Cardiovascular Disease

## 2020-05-14 ENCOUNTER — Other Ambulatory Visit: Payer: Self-pay | Admitting: Nurse Practitioner

## 2020-05-14 DIAGNOSIS — Z1231 Encounter for screening mammogram for malignant neoplasm of breast: Secondary | ICD-10-CM

## 2020-06-05 ENCOUNTER — Other Ambulatory Visit: Payer: Self-pay

## 2020-06-05 ENCOUNTER — Ambulatory Visit (INDEPENDENT_AMBULATORY_CARE_PROVIDER_SITE_OTHER): Payer: BC Managed Care – PPO | Admitting: Cardiovascular Disease

## 2020-06-05 ENCOUNTER — Encounter: Payer: Self-pay | Admitting: Cardiovascular Disease

## 2020-06-05 VITALS — BP 128/70 | HR 68 | Ht 62.0 in | Wt 191.4 lb

## 2020-06-05 DIAGNOSIS — I10 Essential (primary) hypertension: Secondary | ICD-10-CM | POA: Diagnosis not present

## 2020-06-05 DIAGNOSIS — E7800 Pure hypercholesterolemia, unspecified: Secondary | ICD-10-CM | POA: Diagnosis not present

## 2020-06-05 NOTE — Progress Notes (Signed)
[facility] Note   Date:  06/05/2020   ID:  Joanna Powell, DOB Nov 19, 1955, MRN [id number]  PCP:  Arnette Felts, FNP  Cardiologist:   Chilton Si, MD   Chief Complaint  Patient presents with  . Follow-up     History of Present Illness: Joanna Powell is a 65 y.o. female with hypertension, hyperlipidemia and prior tobacco abuse here for follow up.  She was initially seen 12/2019.  At that time she wanted to work on diet and exercise.  She was also started on amlodipine.  The dose was subsequently increased with our pharmacist.  She was initially diagnosed with HTN may years ago.  She was initially on valsartan and switched to olmesartan due to the recall.  Since she last saw our pharmacist pressure has been well have been controlled at home.  Overall she has been fine from a cardiovascular standpoint.  Last week she started to feel poorly.  She did not get back into some money that was negative.  Yesterday she started having a sore throat.  She has no lack of taste or smell.  She also denies any fevers, chills, congestion, lack of taste or smell.  She did not get a COVID-19 vaccination.  She is going to need to have testing at her work.  She notes that her appetite has been poor this week and she is losing weight.  Past Medical History:  Diagnosis Date  . Pure hypercholesterolemia 01/24/2020    No past surgical history on file.   Current Outpatient Medications  Medication Sig Dispense Refill  . amLODipine-olmesartan (AZOR) 10-40 MG tablet Take 1 tablet by mouth daily. 90 tablet 3  . atorvastatin (LIPITOR) 10 MG tablet TAKE 1 TABLET BY MOUTH EVERY DAY 90 tablet 1   No current facility-administered medications for this visit.    Allergies:   Patient has no known allergies.    Social History:  The patient  reports that she has quit smoking. She has never used smokeless tobacco. She reports current alcohol use. She reports that she does not use drugs.   Family  History:  The patient's family history includes Breast cancer in her sister; CAD in her brother and mother; Cancer in her mother; Heart disease in her father; Hypertension in her father and mother.    ROS:  Please see the history of present illness.   Otherwise, review of systems are positive for none.   All other systems are reviewed and negative.    PHYSICAL EXAM: VS:  BP 128/70   Pulse 68   Ht 5\' 2"  (1.575 m)   Wt 191 lb 6.4 oz (86.8 kg)   SpO2 98%   BMI 35.01 kg/m  , BMI Body mass index is 35.01 kg/m. GENERAL:  Slightly ill-appearing. HEENT: Pupils equal round and reactive, fundi not visualized, oral mucosa unremarkable NECK:  No jugular venous distention, waveform within normal limits, carotid upstroke brisk and symmetric, no bruits LUNGS:  Clear to auscultation bilaterally HEART:  RRR.  PMI not displaced or sustained,S1 and S2 within normal limits, no S3, no S4, no clicks, no rubs, no murmurs ABD:  Flat, positive bowel sounds normal in frequency in pitch, no bruits, no rebound, no guarding, no midline pulsatile mass, no hepatomegaly, no splenomegaly EXT:  2 plus pulses throughout, no edema, no cyanosis no clubbing SKIN:  No rashes no nodules NEURO:  Cranial nerves II through XII grossly intact, motor grossly intact throughout PSYCH:  Cognitively intact, oriented to person  place and time   EKG:  EKG is not ordered today. The ekg ordered 12/2019 demonstrates sinus rhythm.  Rate 61 bpm.  LVH.   Recent Labs: 08/31/2019: Hemoglobin 13.9; Platelets 394 02/08/2020: ALT 20; BUN 18; Creatinine, Ser 1.27; Potassium 4.7; Sodium 140    Lipid Panel    Component Value Date/Time   CHOL 176 02/08/2020 1018   TRIG 86 02/08/2020 1018   HDL 50 02/08/2020 1018   CHOLHDL 3.5 02/08/2020 1018   LDLCALC 110 (H) 02/08/2020 1018      Wt Readings from Last 3 Encounters:  06/05/20 191 lb 6.4 oz (86.8 kg)  04/18/20 198 lb 9.6 oz (90.1 kg)  03/21/20 199 lb (90.3 kg)      ASSESSMENT AND  PLAN:  # Essential hypertension: Blood pressure has been much better controlled.  Continue amlodipine and losartan.  Encouraged her to start back exercising when she is feeling better.  # Palpitations: Symptoms are sporadic consistently PACs or PVCs.  She had the appropriate laboratory testing and has been unremarkable.  Symptoms are to intermittently for a monitor.  # Hyperlipidemia : Continue atorvastatin.  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   Current medicines are reviewed at length with the patient today.  The patient does not have concerns regarding medicines.  The following changes have been made:  no change  Labs/ tests ordered today include:   No orders of the defined types were placed in this encounter.    Disposition:   FU with Antonya Leeder C. Duke Salvia, MD, [facility] in 6 months.  P    Signed, Alpha Chouinard C. Duke Salvia, MD, [facility]  06/05/2020 9:04 AM    [facility]

## 2020-06-05 NOTE — Patient Instructions (Signed)
Medication Instructions:  Your physician recommends that you continue on your current medications as directed. Please refer to the Current Medication list given to you today.   Labwork: NONE  Testing/Procedures: NONE  Follow-Up: 11/11/2020 AT 8 AM   If you need a refill on your cardiac medications before your next appointment, please call your pharmacy.

## 2020-06-26 ENCOUNTER — Encounter: Payer: Self-pay | Admitting: Nurse Practitioner

## 2020-06-26 LAB — HM COLONOSCOPY

## 2020-06-27 ENCOUNTER — Other Ambulatory Visit: Payer: Self-pay

## 2020-06-27 ENCOUNTER — Ambulatory Visit
Admission: RE | Admit: 2020-06-27 | Discharge: 2020-06-27 | Disposition: A | Discharge status: Home / Self Care | Payer: BC Managed Care – PPO | Source: Ambulatory Visit | Attending: Nurse Practitioner | Admitting: Nurse Practitioner

## 2020-06-27 DIAGNOSIS — Z1231 Encounter for screening mammogram for malignant neoplasm of breast: Secondary | ICD-10-CM

## 2020-09-12 ENCOUNTER — Encounter: Payer: BC Managed Care – PPO | Admitting: Nurse Practitioner

## 2020-10-03 ENCOUNTER — Ambulatory Visit (INDEPENDENT_AMBULATORY_CARE_PROVIDER_SITE_OTHER): Payer: BC Managed Care – PPO | Admitting: Nurse Practitioner

## 2020-10-03 ENCOUNTER — Encounter: Payer: Self-pay | Admitting: Nurse Practitioner

## 2020-10-03 ENCOUNTER — Other Ambulatory Visit: Payer: Self-pay

## 2020-10-03 VITALS — BP 128/80 | HR 68 | Temp 98.1°F | Ht 62.2 in | Wt 195.6 lb

## 2020-10-03 DIAGNOSIS — Z0001 Encounter for general adult medical examination with abnormal findings: Secondary | ICD-10-CM | POA: Diagnosis not present

## 2020-10-03 DIAGNOSIS — E7800 Pure hypercholesterolemia, unspecified: Secondary | ICD-10-CM

## 2020-10-03 DIAGNOSIS — I129 Hypertensive chronic kidney disease with stage 1 through stage 4 chronic kidney disease, or unspecified chronic kidney disease: Secondary | ICD-10-CM

## 2020-10-03 DIAGNOSIS — Z2821 Immunization not carried out because of patient refusal: Secondary | ICD-10-CM

## 2020-10-03 DIAGNOSIS — N1831 Chronic kidney disease, stage 3a: Secondary | ICD-10-CM

## 2020-10-03 DIAGNOSIS — Z6835 Body mass index (BMI) 35.0-35.9, adult: Secondary | ICD-10-CM

## 2020-10-03 DIAGNOSIS — R7303 Prediabetes: Secondary | ICD-10-CM

## 2020-10-03 DIAGNOSIS — Z0000 Encounter for general adult medical examination without abnormal findings: Secondary | ICD-10-CM

## 2020-10-03 DIAGNOSIS — I10 Essential (primary) hypertension: Secondary | ICD-10-CM

## 2020-10-03 DIAGNOSIS — H6122 Impacted cerumen, left ear: Secondary | ICD-10-CM

## 2020-10-03 DIAGNOSIS — E559 Vitamin D deficiency, unspecified: Secondary | ICD-10-CM

## 2020-10-03 LAB — POCT URINALYSIS DIPSTICK
Bilirubin, UA: NEGATIVE
Blood, UA: NEGATIVE
Glucose, UA: NEGATIVE
Ketones, UA: NEGATIVE
Leukocytes, UA: NEGATIVE
Nitrite, UA: NEGATIVE
Protein, UA: NEGATIVE
Spec Grav, UA: 1.02 (ref 1.010–1.025)
Urobilinogen, UA: 0.2 E.U./dL
pH, UA: 5.5 (ref 5.0–8.0)

## 2020-10-03 LAB — POCT UA - MICROALBUMIN
Albumin/Creatinine Ratio, Urine, POC: <30
Creatinine, POC: 300 mg/dL
Microalbumin Ur, POC: 30 mg/L

## 2020-10-03 NOTE — Progress Notes (Signed)
I,[name],acting as a Education administrator for [employer], NP.,have documented all relevant documentation on the behalf of [employer], NP,as directed by  Bary Castilla, NP while in the presence of Bary Castilla, NP.  This visit occurred during the SARS-CoV-2 public health emergency.  Safety protocols were in place, including screening questions prior to the visit, additional usage of staff PPE, and extensive cleaning of exam room while observing appropriate contact time as indicated for disinfecting solutions.  Subjective:     Patient ID: Joanna Powell , female    DOB: [birth year] , 65 y.o.   MRN: [id number]   Chief Complaint  Patient presents with  . Annual Exam    HPI  Patient is here for physical exam. She is followed by Dr Garwin Brothers for her GYN care. She is scheduled to have a PAP in June. Patient reports compliance with medications. She has no concerns at this time. She is also seeing a kidney specialist this month.  Diet: She is working on her diet.  Exercise: She tries to get some exercise done.   She gets vision test every year and visits the dentist every year as well. She got her mammogram done this year as well. No other concerns.     Hypertension This is a chronic problem. The current episode started more than 1 year ago. The problem is controlled. Pertinent negatives include no chest pain, headaches, palpitations or shortness of breath. Risk factors for coronary artery disease include dyslipidemia, sedentary lifestyle and obesity. Past treatments include angiotensin blockers. Compliance problems include exercise.  There is no history of angina.     Past Medical History:  Diagnosis Date  . Hypertension   . Pure hypercholesterolemia 01/24/2020     Family History  Problem Relation Age of Onset  . Breast cancer Sister   . Cancer Mother   . Hypertension Mother   . CAD Mother   . Hypertension Father   . Heart disease Father   . CAD Brother      Current  Outpatient Medications:  .  amLODipine-olmesartan (AZOR) 10-40 MG tablet, Take 1 tablet by mouth daily., Disp: 90 tablet, Rfl: 3 .  atorvastatin (LIPITOR) 10 MG tablet, TAKE 1 TABLET BY MOUTH EVERY DAY, Disp: 90 tablet, Rfl: 1   No Known Allergies    The patient states she uses nothing for birth control. Last LMP was No LMP recorded. Patient is postmenopausal.. Negative for: breast discharge, breast lump(s), breast pain and breast self exam. Associated symptoms include abnormal vaginal bleeding. Pertinent negatives include abnormal bleeding (hematology), anxiety, decreased libido, depression, difficulty falling sleep, dyspareunia, history of infertility, nocturia, sexual dysfunction, sleep disturbances, urinary incontinence, urinary urgency, vaginal discharge and vaginal itching. Diet regular.The patient states her exercise level is    . The patient's tobacco use is:  Social History   Tobacco Use  Smoking Status Former Smoker  Smokeless Tobacco Never Used  . She has been exposed to passive smoke. The patient's alcohol use is:  Social History   Substance and Sexual Activity  Alcohol Use Yes   Comment: occasional  . Additional information:, Next Pap one scheduled for next month June 2022.    Review of Systems  Constitutional: Negative.  Negative for chills and fever.  HENT: Negative.  Negative for congestion, ear pain, rhinorrhea and sinus pain.   Eyes: Negative.  Negative for itching and visual disturbance.  Respiratory: Negative.  Negative for cough, shortness of breath and wheezing.   Cardiovascular: Negative.  Negative for chest pain  and palpitations.  Gastrointestinal: Negative.  Negative for constipation, diarrhea, nausea and vomiting.  Endocrine: Negative.  Negative for polydipsia, polyphagia and polyuria.  Genitourinary: Negative.   Musculoskeletal: Negative.  Negative for arthralgias and myalgias.  Skin: Negative.   Allergic/Immunologic: Negative.   Neurological: Negative.   Negative for headaches.  Hematological: Negative.   Psychiatric/Behavioral: Negative.      Today's Vitals   10/03/20 0841  BP: 128/80  Pulse: 68  Temp: 98.1 F (36.7 C)  TempSrc: Oral  Weight: 195 lb 9.6 oz (88.7 kg)  Height: 5' 2.2" (1.58 m)   Body mass index is 35.55 kg/m.   Objective:  Physical Exam Vitals and nursing note reviewed.  Constitutional:      Appearance: Normal appearance.  HENT:     Head: Normocephalic and atraumatic.     Right Ear: Tympanic membrane, ear canal and external ear normal.     Left Ear: Tympanic membrane, ear canal and external ear normal. There is impacted cerumen.     Nose: Nose normal. No congestion.     Mouth/Throat:     Mouth: Mucous membranes are moist.     Pharynx: Oropharynx is clear.  Eyes:     Extraocular Movements: Extraocular movements intact.     Conjunctiva/sclera: Conjunctivae normal.     Pupils: Pupils are equal, round, and reactive to light.  Cardiovascular:     Rate and Rhythm: Normal rate and regular rhythm.     Pulses: Normal pulses.     Heart sounds: Normal heart sounds.  Pulmonary:     Effort: Pulmonary effort is normal. No respiratory distress.     Breath sounds: Normal breath sounds. No wheezing.  Chest:  Breasts:     Tanner Score is 5.     Right: Normal.     Left: Normal.    Abdominal:     General: Abdomen is flat. Bowel sounds are normal.     Palpations: Abdomen is soft.  Genitourinary:    Comments: deferred Musculoskeletal:        General: Normal range of motion.     Cervical back: Normal range of motion and neck supple.  Skin:    General: Skin is warm and dry.     Capillary Refill: Capillary refill takes less than 2 seconds.  Neurological:     General: No focal deficit present.     Mental Status: She is alert and oriented to person, place, and time.  Psychiatric:        Mood and Affect: Mood normal.        Behavior: Behavior normal.         Assessment And Plan:     1. Health maintenance  examination --Patient is here for their annual physical exam and we discussed any changes to medication and medical history.  -Behavior modification was discussed as well as diet and exercise history  -Patient will continue to exercise regularly and modify their diet.  -Recommendation for yearly physical annuals, immunization and screenings including mammogram and colonoscopy were discussed with the patient.  -Recommended intake of multivitamin, vitamin D and calcium.  -Individualized advise was given to the patient pertaining to their own health history in regards to diet, exercise, medical condition and referrals.  - CBC - CMP14+EGFR - HIV Antibody (routine testing w rflx)  2. Essential hypertension -Chronic, BP controlled today. She is taking the amlodipine as prescribed and following up with Dr. Oval Linsey with cardiology.  - POCT Urinalysis Dipstick (81002) - POCT UA - Microalbumin -  EKG 12-Lead- NSR   3. Pure hypercholesterolemia -Chronic, stable  -Continue current meds, tolerating well.  -Encouraged to continue with a low fat diet avoiding fast food and fried food.  - Lipid panel  4. Prediabetes -Chronic, stable -Continue with diabetic healthy diet.  - Hemoglobin A1c  5. Vitamin D deficiency -will check vitamin D level and supplement as needed  -encouraged to spend 15 min in the sun daily.  - VITAMIN D 25 Hydroxy (Vit-D Deficiency, Fractures)  6. Stage 3a chronic kidney disease ([place]) -She is followed by a kidney specialist   7. Impacted cerumen of left ear -Used curette to clean left ear  -Tolerated well.   8. COVID-19 vaccination declined -Patient declined vaccine at this time.  -Advised patient to wear mask while in public   9. Class 2 severe obesity due to excess calories with serious comorbidity and body mass index (BMI) of 35.0 to 35.9 in adult [employer]) -Advised patient on a healthy diet including avoiding fast food and red meats. Increase the intake of lean meats  including grilled chicken and [place].  Drink a lot of water. Decrease intake of fatty foods. Exercise for 30-45 min. 4-5 a week to decrease the risk of cardiac event.   Staying healthy and adopting a healthy lifestyle for your overall health is important. You should eat 7 or more servings of fruits and vegetables per day. You should drink plenty of water to keep yourself hydrated and your kidneys healthy. This includes about 65-80+ fluid ounces of water. Limit your intake of animal fats especially for elevated cholesterol. Avoid highly processed food and limit your salt intake if you have hypertension. Avoid foods high in saturated/Trans fats. Along with a healthy diet it is also very important to maintain time for yourself to maintain a healthy mental health with low stress levels. You should get atleast 150 min of moderate intensity exercise weekly for a healthy heart. Along with eating right and exercising, aim for at least 7-9 hours of sleep daily.  Eat more whole grains which includes barley, wheat berries, oats, brown rice and whole wheat pasta. Use healthy plant oils which include olive, soy, corn, sunflower and peanut. Limit your caffeine and sugary drinks. Limit your intake of fast foods. Limit milk and dairy products to one or two daily servings.     Patient was given opportunity to ask questions. Patient verbalized understanding of the plan and was able to repeat key elements of the plan. All questions were answered to their satisfaction.  Bary Castilla, DNP   I, Bary Castilla, DNP have reviewed all documentation for this visit. The documentation on 10/03/20 for the exam, diagnosis, procedures, and orders are all accurate and complete.  THE PATIENT IS ENCOURAGED TO PRACTICE SOCIAL DISTANCING DUE TO THE COVID-19 PANDEMIC.

## 2020-10-03 NOTE — Patient Instructions (Signed)
Health Maintenance, Female Adopting a healthy lifestyle and getting preventive care are important in promoting health and wellness. Ask your health care provider about:  The right schedule for you to have regular tests and exams.  Things you can do on your own to prevent diseases and keep yourself healthy. What should I know about diet, weight, and exercise? Eat a healthy diet  Eat a diet that includes plenty of vegetables, fruits, low-fat dairy products, and lean protein.  Do not eat a lot of foods that are high in solid fats, added sugars, or sodium.   Maintain a healthy weight Body mass index (BMI) is used to identify weight problems. It estimates body fat based on height and weight. Your health care provider can help determine your BMI and help you achieve or maintain a healthy weight. Get regular exercise Get regular exercise. This is one of the most important things you can do for your health. Most adults should:  Exercise for at least 150 minutes each week. The exercise should increase your heart rate and make you sweat (moderate-intensity exercise).  Do strengthening exercises at least twice a week. This is in addition to the moderate-intensity exercise.  Spend less time sitting. Even light physical activity can be beneficial. Watch cholesterol and blood lipids Have your blood tested for lipids and cholesterol at 65 years of age, then have this test every 5 years. Have your cholesterol levels checked more often if:  Your lipid or cholesterol levels are high.  You are older than 65 years of age.  You are at high risk for heart disease. What should I know about cancer screening? Depending on your health history and family history, you may need to have cancer screening at various ages. This may include screening for:  Breast cancer.  Cervical cancer.  Colorectal cancer.  Skin cancer.  Lung cancer. What should I know about heart disease, diabetes, and high blood  pressure? Blood pressure and heart disease  High blood pressure causes heart disease and increases the risk of stroke. This is more likely to develop in people who have high blood pressure readings, are of African descent, or are overweight.  Have your blood pressure checked: ? Every 3-5 years if you are 18-39 years of age. ? Every year if you are 40 years old or older. Diabetes Have regular diabetes screenings. This checks your fasting blood sugar level. Have the screening done:  Once every three years after age 40 if you are at a normal weight and have a low risk for diabetes.  More often and at a younger age if you are overweight or have a high risk for diabetes. What should I know about preventing infection? Hepatitis B If you have a higher risk for hepatitis B, you should be screened for this virus. Talk with your health care provider to find out if you are at risk for hepatitis B infection. Hepatitis C Testing is recommended for:  Everyone born from 1945 through 1965.  Anyone with known risk factors for hepatitis C. Sexually transmitted infections (STIs)  Get screened for STIs, including gonorrhea and chlamydia, if: ? You are sexually active and are younger than 65 years of age. ? You are older than 65 years of age and your health care provider tells you that you are at risk for this type of infection. ? Your sexual activity has changed since you were last screened, and you are at increased risk for chlamydia or gonorrhea. Ask your health care provider   if you are at risk.  Ask your health care provider about whether you are at high risk for HIV. Your health care provider may recommend a prescription medicine to help prevent HIV infection. If you choose to take medicine to prevent HIV, you should first get tested for HIV. You should then be tested every 3 months for as long as you are taking the medicine. Pregnancy  If you are about to stop having your period (premenopausal) and  you may become pregnant, seek counseling before you get pregnant.  Take 400 to 800 micrograms (mcg) of folic acid every day if you become pregnant.  Ask for birth control (contraception) if you want to prevent pregnancy. Osteoporosis and menopause Osteoporosis is a disease in which the bones lose minerals and strength with aging. This can result in bone fractures. If you are 65 years old or older, or if you are at risk for osteoporosis and fractures, ask your health care provider if you should:  Be screened for bone loss.  Take a calcium or vitamin D supplement to lower your risk of fractures.  Be given hormone replacement therapy (HRT) to treat symptoms of menopause. Follow these instructions at home: Lifestyle  Do not use any products that contain nicotine or tobacco, such as cigarettes, e-cigarettes, and chewing tobacco. If you need help quitting, ask your health care provider.  Do not use street drugs.  Do not share needles.  Ask your health care provider for help if you need support or information about quitting drugs. Alcohol use  Do not drink alcohol if: ? Your health care provider tells you not to drink. ? You are pregnant, may be pregnant, or are planning to become pregnant.  If you drink alcohol: ? Limit how much you use to 0-1 drink a day. ? Limit intake if you are breastfeeding.  Be aware of how much alcohol is in your drink. In the [place] one drink equals one 12 oz bottle of beer (355 mL), one 5 oz glass of wine (148 mL), or one 1 oz glass of hard liquor (44 mL). General instructions  Schedule regular health, dental, and eye exams.  Stay current with your vaccines.  Tell your health care provider if: ? You often feel depressed. ? You have ever been abused or do not feel safe at home. Summary  Adopting a healthy lifestyle and getting preventive care are important in promoting health and wellness.  Follow your health care provider's instructions about healthy  diet, exercising, and getting tested or screened for diseases.  Follow your health care provider's instructions on monitoring your cholesterol and blood pressure. This information is not intended to replace advice given to you by your health care provider. Make sure you discuss any questions you have with your health care provider. Document Revised: 05/11/2018 Document Reviewed: 05/11/2018 Elsevier Patient Education  2021 [employer] Inc.  

## 2020-10-04 ENCOUNTER — Other Ambulatory Visit: Payer: Self-pay | Admitting: Nurse Practitioner

## 2020-10-04 DIAGNOSIS — E559 Vitamin D deficiency, unspecified: Secondary | ICD-10-CM

## 2020-10-04 LAB — CMP14+EGFR
ALT: 18 IU/L (ref 0–32)
AST: 19 IU/L (ref 0–40)
Albumin/Globulin Ratio: 1.7 (ref 1.2–2.2)
Albumin: 4.7 g/dL (ref 3.8–4.8)
Alkaline Phosphatase: 112 IU/L (ref 44–121)
BUN/Creatinine Ratio: 17 (ref 12–28)
BUN: 23 mg/dL (ref 8–27)
Bilirubin Total: 0.5 mg/dL (ref 0.0–1.2)
CO2: 24 mmol/L (ref 20–29)
Calcium: 9.6 mg/dL (ref 8.7–10.3)
Chloride: 102 mmol/L (ref 96–106)
Creatinine, Ser: 1.34 mg/dL — ABNORMAL HIGH (ref 0.57–1.00)
Globulin, Total: 2.8 g/dL (ref 1.5–4.5)
Glucose: 95 mg/dL (ref 65–99)
Potassium: 5.2 mmol/L (ref 3.5–5.2)
Sodium: 141 mmol/L (ref 134–144)
Total Protein: 7.5 g/dL (ref 6.0–8.5)
eGFR: 44 mL/min/{1.73_m2} — ABNORMAL LOW (ref >59)

## 2020-10-04 LAB — LIPID PANEL
Chol/HDL Ratio: 4 ratio (ref 0.0–4.4)
Cholesterol, Total: 192 mg/dL (ref 100–199)
HDL: 48 mg/dL (ref >39)
LDL Chol Calc (NIH): 125 mg/dL — ABNORMAL HIGH (ref 0–99)
Triglycerides: 103 mg/dL (ref 0–149)
VLDL Cholesterol Cal: 19 mg/dL (ref 5–40)

## 2020-10-04 LAB — HEMOGLOBIN A1C
Est. average glucose Bld gHb Est-mCnc: 123 mg/dL
Hgb A1c MFr Bld: 5.9 % — ABNORMAL HIGH (ref 4.8–5.6)

## 2020-10-04 LAB — CBC
Hematocrit: 40 % (ref 34.0–46.6)
Hemoglobin: 13 g/dL (ref 11.1–15.9)
MCH: 26.5 pg — ABNORMAL LOW (ref 26.6–33.0)
MCHC: 32.5 g/dL (ref 31.5–35.7)
MCV: 82 fL (ref 79–97)
Platelets: 426 10*3/uL (ref 150–450)
RBC: 4.9 x10E6/uL (ref 3.77–5.28)
RDW: 13.1 % (ref 11.7–15.4)
WBC: 5 10*3/uL (ref 3.4–10.8)

## 2020-10-04 LAB — HIV ANTIBODY (ROUTINE TESTING W REFLEX): HIV Screen 4th Generation wRfx: NONREACTIVE

## 2020-10-04 LAB — VITAMIN D 25 HYDROXY (VIT D DEFICIENCY, FRACTURES): Vit D, 25-Hydroxy: 19.1 ng/mL — ABNORMAL LOW (ref 30.0–100.0)

## 2020-10-07 ENCOUNTER — Other Ambulatory Visit: Payer: Self-pay

## 2020-10-07 DIAGNOSIS — E559 Vitamin D deficiency, unspecified: Secondary | ICD-10-CM

## 2020-10-07 MED ORDER — VITAMIN D (ERGOCALCIFEROL) 1.25 MG (50000 UNIT) PO CAPS: 50000.0000 [IU] | ORAL_CAPSULE | ORAL | 0 refills | Status: DC

## 2020-10-17 ENCOUNTER — Other Ambulatory Visit: Payer: Self-pay | Admitting: Nurse Practitioner

## 2020-11-11 ENCOUNTER — Ambulatory Visit: Payer: BC Managed Care – PPO | Admitting: Cardiovascular Disease

## 2020-11-21 ENCOUNTER — Encounter: Payer: Self-pay | Admitting: Nurse Practitioner

## 2021-01-09 ENCOUNTER — Ambulatory Visit: Payer: BC Managed Care – PPO | Admitting: Nurse Practitioner

## 2021-01-16 ENCOUNTER — Ambulatory Visit: Payer: BC Managed Care – PPO | Admitting: Nurse Practitioner

## 2021-01-22 ENCOUNTER — Other Ambulatory Visit: Payer: Self-pay

## 2021-01-22 ENCOUNTER — Encounter: Payer: Self-pay | Admitting: Nurse Practitioner

## 2021-01-22 ENCOUNTER — Ambulatory Visit: Payer: BC Managed Care – PPO | Admitting: Nurse Practitioner

## 2021-01-22 VITALS — BP 126/88 | HR 70 | Temp 98.5°F | Ht 62.2 in | Wt 183.0 lb

## 2021-01-22 DIAGNOSIS — I129 Hypertensive chronic kidney disease with stage 1 through stage 4 chronic kidney disease, or unspecified chronic kidney disease: Secondary | ICD-10-CM

## 2021-01-22 DIAGNOSIS — E6609 Other obesity due to excess calories: Secondary | ICD-10-CM | POA: Diagnosis not present

## 2021-01-22 DIAGNOSIS — E559 Vitamin D deficiency, unspecified: Secondary | ICD-10-CM | POA: Diagnosis not present

## 2021-01-22 DIAGNOSIS — E7800 Pure hypercholesterolemia, unspecified: Secondary | ICD-10-CM | POA: Diagnosis not present

## 2021-01-22 DIAGNOSIS — N183 Chronic kidney disease, stage 3 unspecified: Secondary | ICD-10-CM

## 2021-01-22 DIAGNOSIS — Z6833 Body mass index (BMI) 33.0-33.9, adult: Secondary | ICD-10-CM

## 2021-01-22 NOTE — Progress Notes (Signed)
I,Katawbba Wiggins,acting as a Education administrator for [employer], FNP.,have documented all relevant documentation on the behalf of Minette Brine, FNP,as directed by  Minette Brine, FNP while in the presence of Minette Brine, [place].   This visit occurred during the SARS-CoV-2 public health emergency.  Safety protocols were in place, including screening questions prior to the visit, additional usage of staff PPE, and extensive cleaning of exam room while observing appropriate contact time as indicated for disinfecting solutions.  Subjective:     Patient ID: Joanna Powell , female    DOB: [birth year] , 65 y.o.   MRN: [id number]   Chief Complaint  Patient presents with   Hypertension    HPI  The patient is here today for a blood pressure f/u.  Overall she is doing well. Nephrology changed her losartan/amlodipine to 1/2 tablet.   Wt Readings from Last 3 Encounters: 01/22/21 : 183 lb (83 kg) 10/03/20 : 195 lb 9.6 oz (88.7 kg) 06/05/20 : 191 lb 6.4 oz (86.8 kg)    Hypertension This is a chronic problem. The current episode started more than 1 year ago. The problem is unchanged (diastolic is slightly elevated). The problem is controlled. Pertinent negatives include no chest pain, headaches or palpitations. There are no associated agents to hypertension. Risk factors for coronary artery disease include dyslipidemia, sedentary lifestyle and obesity. Past treatments include angiotensin blockers. Compliance problems include exercise.  There is no history of angina. There is no history of chronic renal disease.    Past Medical History:  Diagnosis Date   Hypertension    Pure hypercholesterolemia 01/24/2020     Family History  Problem Relation Age of Onset   Breast cancer Sister    Cancer Mother    Hypertension Mother    CAD Mother    Hypertension Father    Heart disease Father    CAD Brother      Current Outpatient Medications:    amLODipine-olmesartan (AZOR) 10-40 MG tablet, Take 1 tablet by mouth  daily. (Patient taking differently: Take 1 tablet by mouth daily. 1/2 tab daily), Disp: 90 tablet, Rfl: 3   Vitamin D, Ergocalciferol, (DRISDOL) 1.25 MG (50000 UNIT) CAPS capsule, Take 1 capsule (50,000 Units total) by mouth every 7 (seven) days., Disp: 24 capsule, Rfl: 0   atorvastatin (LIPITOR) 10 MG tablet, TAKE 1 TABLET BY MOUTH EVERY DAY (Patient not taking: Reported on 01/22/2021), Disp: 90 tablet, Rfl: 1   No Known Allergies   Review of Systems  Constitutional: Negative.  Negative for fatigue.  Respiratory: Negative.  Negative for cough and wheezing.   Cardiovascular: Negative.  Negative for chest pain, palpitations and leg swelling.  Gastrointestinal: Negative.   Neurological:  Negative for dizziness and headaches.  Psychiatric/Behavioral: Negative.    All other systems reviewed and are negative.   Today's Vitals   01/22/21 0840  BP: 126/88  Pulse: 70  Temp: 98.5 F (36.9 C)  TempSrc: Oral  Weight: 183 lb (83 kg)  Height: 5' 2.2" (1.58 m)   Body mass index is 33.26 kg/m.  Wt Readings from Last 3 Encounters:  01/22/21 183 lb (83 kg)  10/03/20 195 lb 9.6 oz (88.7 kg)  06/05/20 191 lb 6.4 oz (86.8 kg)    BP Readings from Last 3 Encounters:  01/22/21 126/88  10/03/20 128/80  06/05/20 128/70    Objective:  Physical Exam Vitals reviewed.  Constitutional:      General: She is not in acute distress.    Appearance: Normal appearance. She is obese.  Cardiovascular:     Rate and Rhythm: Normal rate and regular rhythm.     Pulses: Normal pulses.     Heart sounds: Normal heart sounds. No murmur heard. Pulmonary:     Effort: Pulmonary effort is normal. No respiratory distress.     Breath sounds: Normal breath sounds. No wheezing.  Skin:    General: Skin is warm and dry.     Capillary Refill: Capillary refill takes less than 2 seconds.  Neurological:     General: No focal deficit present.     Mental Status: She is alert and oriented to person, place, and time.      Cranial Nerves: No cranial nerve deficit.     Motor: No weakness.  Psychiatric:        Mood and Affect: Mood normal.        Behavior: Behavior normal.        Thought Content: Thought content normal.        Judgment: Judgment normal.        Assessment And Plan:     1. Benign hypertension with chronic kidney disease, stage III (HCC) Chronic, good control Continue your follow up with Dr. Oval Linsey and she is to discuss whether she needs to stay on her current dose of blood pressure medications - BMP8+EGFR - Amb ref to Medical Nutrition Therapy-MNT - CMP14+EGFR  2. Class 1 obesity due to excess calories with serious comorbidity and body mass index (BMI) of 33.0 to 33.9 in adult Comments: Congratulated on 12 lbs weight loss  3. Pure hypercholesterolemia Chronic, tolerating statin well - Lipid panel  4. Vitamin D deficiency Will check vitamin D level and supplement as needed.    Also encouraged to spend 15 minutes in the sun daily.  - VITAMIN D 25 Hydroxy (Vit-D Deficiency, Fractures)    Patient was given opportunity to ask questions. Patient verbalized understanding of the plan and was able to repeat key elements of the plan. All questions were answered to their satisfaction.  Minette Brine, FNP   I, Minette Brine, FNP, have reviewed all documentation for this visit. The documentation on 01/22/21 for the exam, diagnosis, procedures, and orders are all accurate and complete.   IF YOU HAVE BEEN REFERRED TO A SPECIALIST, IT MAY TAKE 1-2 WEEKS TO SCHEDULE/PROCESS THE REFERRAL. IF YOU HAVE NOT HEARD FROM US/SPECIALIST IN TWO WEEKS, PLEASE GIVE [place] A CALL AT [phone].   THE PATIENT IS ENCOURAGED TO PRACTICE SOCIAL DISTANCING DUE TO THE COVID-19 PANDEMIC.

## 2021-01-22 NOTE — Patient Instructions (Signed)
Cooking With Less Salt Cooking with less salt is one way to reduce the amount of sodium you get from food. Sodium is one of the elements that make up salt. It is found naturally in foods and is also added to certain foods. Depending on your condition and overall health, your health care provider or dietitian may recommend that you reduce your sodium intake. Most people should have less than 2,300 milligrams (mg) of sodium each day. If you have high blood pressure (hypertension), you may need to limit your sodium to 1,500 mg each day. Follow the tipsbelow to help reduce your sodium intake. What are tips for eating less sodium? Reading food labels  Check the food label before buying or using packaged ingredients. Always check the label for the serving size and sodium content. Look for products with no more than 140 mg of sodium in one serving. Check the % Daily Value column to see what percent of the daily recommended amount of sodium is provided in one serving of the product. Foods with 5% or less in this column are considered low in sodium. Foods with 20% or higher are considered high in sodium. Do not choose foods with salt as one of the first three ingredients on the ingredients list. If salt is one of the first three ingredients, it usually means the item is high in sodium.  Shopping Buy sodium-free or low-sodium products. Look for the following words on food labels: Low-sodium. Sodium-free. Reduced-sodium. No salt added. Unsalted. Always check the sodium content even if foods are labeled as low-sodium or no salt added. Buy fresh foods. Cooking Use herbs, seasonings without salt, and spices as substitutes for salt. Use sodium-free baking soda when baking. Grill, braise, or roast foods to add flavor with less salt. Avoid adding salt to pasta, rice, or hot cereals. Drain and rinse canned vegetables, beans, and meat before use. Avoid adding salt when cooking sweets and desserts. Cook with  low-sodium ingredients. What foods are high in sodium? Vegetables Regular canned vegetables (not low-sodium or reduced-sodium). Sauerkraut, pickled vegetables, and relishes. Olives. French fries. Onion rings. Regular canned tomato sauce and paste. Regular tomato and vegetable juice. Frozenvegetables in sauces. Grains Instant hot cereals. Bread stuffing, pancake, and biscuit mixes. Croutons. Seasoned rice or pasta mixes. Noodle soup cups. Boxed or frozen macaroni and cheese. Regular salted crackers. Self-rising flour. Rolls. Bagels. Flourtortillas and wraps. Meats and other proteins Meat or fish that is salted, canned, smoked, cured, spiced, or pickled. This includes bacon, ham, sausages, hot dogs, corned beef, chipped beef, meat loaves, salt pork, jerky, pickled herring, anchovies, regular canned tuna, andsardines. Salted nuts. Dairy Processed cheese and cheese spreads. Cheese curds. Blue cheese. Feta cheese.String cheese. Regular cottage cheese. Buttermilk. Canned milk. The items listed above may not be a complete list of foods high in sodium. Actual amounts of sodium may be different depending on processing. Contact a dietitian for more information. What foods are low in sodium? Fruits Fresh, frozen, or canned fruit with no sauce added. Fruit juice. Vegetables Fresh or frozen vegetables with no sauce added. "No salt added" canned vegetables. "No salt added" tomato sauce and paste. Low-sodium orreduced-sodium tomato and vegetable juice. Grains Noodles, pasta, quinoa, rice. Shredded or puffed wheat or puffed rice. Regular or quick oats (not instant). Low-sodium crackers. Low-sodium bread. Whole-grainbread and whole-grain pasta. Unsalted popcorn. Meats and other proteins Fresh or frozen whole meats, poultry (not injected with sodium), and fish with no sauce added. Unsalted nuts. Dried peas, beans, and   lentils without added salt. Unsalted canned beans. Eggs. Unsalted nut butters. Low-sodium canned  tunaor chicken. Dairy Milk. Soy milk. Yogurt. Low-sodium cheeses, such as Swiss, Monterey Jack, mozzarella, and ricotta. Sherbet or ice cream (keep to  cup per serving).Cream cheese. Fats and oils Unsalted butter or margarine. Other foods Homemade pudding. Sodium-free baking soda and baking powder. Herbs and spices.Low-sodium seasoning mixes. Beverages Coffee and tea. Carbonated beverages. The items listed above may not be a complete list of foods low in sodium. Actual amounts of sodium may be different depending on processing. Contact a dietitian for more information. What are some salt alternatives when cooking? The following are herbs, seasonings, and spices that can be used instead of salt to flavor your food. Herbs should be fresh or dried. Do not choose packaged mixes. Next to the name of the herb, spice, or seasoning aresome examples of foods you can pair it with. Herbs Bay leaves - Soups, meat and vegetable dishes, and spaghetti sauce. Basil - Italian dishes, soups, pasta, and fish dishes. Cilantro - Meat, poultry, and vegetable dishes. Chili powder - Marinades and Mexican dishes. Chives - Salad dressings and potato dishes. Cumin - Mexican dishes, couscous, and meat dishes. Dill - Fish dishes, sauces, and salads. Fennel - Meat and vegetable dishes, breads, and cookies. Garlic (do not use garlic salt) - Italian dishes, meat dishes, salad dressings, and sauces. Marjoram - Soups, potato dishes, and meat dishes. Oregano - Pizza and spaghetti sauce. Parsley - Salads, soups, pasta, and meat dishes. Rosemary - Italian dishes, salad dressings, soups, and red meats. Saffron - Fish dishes, pasta, and some poultry dishes. Sage - Stuffings and sauces. Tarragon - Fish and poultry dishes. Thyme - Stuffing, meat, and fish dishes. Seasonings Lemon juice - Fish dishes, poultry dishes, vegetables, and salads. Vinegar - Salad dressings, vegetables, and fish dishes. Spices Cinnamon - Sweet  dishes, such as cakes, cookies, and puddings. Cloves - Gingerbread, puddings, and marinades for meats. Curry - Vegetable dishes, fish and poultry dishes, and stir-fry dishes. Ginger - Vegetable dishes, fish dishes, and stir-fry dishes. Nutmeg - Pasta, vegetables, poultry, fish dishes, and custard. Summary Cooking with less salt is one way to reduce the amount of sodium that you get from food. Buy sodium-free or low-sodium products. Check the food label before using or buying packaged ingredients. Use herbs, seasonings without salt, and spices as substitutes for salt in foods. This information is not intended to replace advice given to you by your health care provider. Make sure you discuss any questions you have with your healthcare provider. Document Revised: 05/10/2019 Document Reviewed: 05/10/2019 Elsevier Patient Education  2022 Elsevier Inc.  

## 2021-01-23 LAB — CMP14+EGFR
ALT: 17 IU/L (ref 0–32)
AST: 22 IU/L (ref 0–40)
Albumin/Globulin Ratio: 1.6 (ref 1.2–2.2)
Albumin: 4.7 g/dL (ref 3.8–4.8)
Alkaline Phosphatase: 89 IU/L (ref 44–121)
BUN/Creatinine Ratio: 11 — ABNORMAL LOW (ref 12–28)
BUN: 15 mg/dL (ref 8–27)
Bilirubin Total: 0.6 mg/dL (ref 0.0–1.2)
CO2: 25 mmol/L (ref 20–29)
Calcium: 9.8 mg/dL (ref 8.7–10.3)
Chloride: 103 mmol/L (ref 96–106)
Creatinine, Ser: 1.38 mg/dL — ABNORMAL HIGH (ref 0.57–1.00)
Globulin, Total: 2.9 g/dL (ref 1.5–4.5)
Glucose: 101 mg/dL — ABNORMAL HIGH (ref 65–99)
Potassium: 5.5 mmol/L — ABNORMAL HIGH (ref 3.5–5.2)
Sodium: 141 mmol/L (ref 134–144)
Total Protein: 7.6 g/dL (ref 6.0–8.5)
eGFR: 42 mL/min/{1.73_m2} — ABNORMAL LOW (ref >59)

## 2021-01-23 LAB — LIPID PANEL
Chol/HDL Ratio: 5.6 ratio — ABNORMAL HIGH (ref 0.0–4.4)
Cholesterol, Total: 246 mg/dL — ABNORMAL HIGH (ref 100–199)
HDL: 44 mg/dL (ref >39)
LDL Chol Calc (NIH): 180 mg/dL — ABNORMAL HIGH (ref 0–99)
Triglycerides: 119 mg/dL (ref 0–149)
VLDL Cholesterol Cal: 22 mg/dL (ref 5–40)

## 2021-01-23 LAB — VITAMIN D 25 HYDROXY (VIT D DEFICIENCY, FRACTURES): Vit D, 25-Hydroxy: 23.2 ng/mL — ABNORMAL LOW (ref 30.0–100.0)

## 2021-01-29 ENCOUNTER — Other Ambulatory Visit: Payer: Self-pay

## 2021-01-29 ENCOUNTER — Encounter (HOSPITAL_BASED_OUTPATIENT_CLINIC_OR_DEPARTMENT_OTHER): Payer: Self-pay | Admitting: Cardiovascular Disease

## 2021-01-29 ENCOUNTER — Ambulatory Visit (INDEPENDENT_AMBULATORY_CARE_PROVIDER_SITE_OTHER): Payer: BC Managed Care – PPO | Admitting: Cardiovascular Disease

## 2021-01-29 VITALS — BP 124/78 | HR 72 | Ht 62.0 in | Wt 182.8 lb

## 2021-01-29 DIAGNOSIS — E7800 Pure hypercholesterolemia, unspecified: Secondary | ICD-10-CM

## 2021-01-29 DIAGNOSIS — I10 Essential (primary) hypertension: Secondary | ICD-10-CM

## 2021-01-29 MED ORDER — AMLODIPINE-OLMESARTAN 5-20 MG PO TABS: 1.0000 | ORAL_TABLET | Freq: Every day | ORAL | 3 refills | Status: DC

## 2021-01-29 NOTE — Progress Notes (Signed)
Blood okay thank you   [facility] Note   Date:  01/29/2021   ID:  Joanna Powell, DOB [birth year], MRN [id number]  PCP:  Arnette Felts, FNP  Cardiologist:   Chilton Si, MD   No chief complaint on file.    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 female with hypertension, hyperlipidemia and prior tobacco abuse here for follow up.  She was initially seen 12/2019.  At that time she wanted to work on diet and exercise.  She was also started on amlodipine.  The dose was subsequently increased with our pharmacist.  She was initially diagnosed with HTN may years ago.  She was initially on valsartan and switched to olmesartan due to the recall.  At her last appointment her blood pressure was improving and she will continue to work on diet and exercise.  She last saw her PCP last week and was congratulated for weight loss.  Her blood pressure at that appointment was 126/88.  Lately she has been feeling well.  She followed up with her nephrologist last week and they reduced her amlodipine/olmesartan to 5/20.  Her blood pressure has been 120s over 70s.  She has been working on her diet.  She plans to start exercising with a trainer soon.  She also likes to walk and bike.  She has no exertional chest pain or shortness of breath.  She denies lower extremity edema, orthopnea, or PND.  She did have an episode of lightheadedness when working outside that she attributed to overheating.  She notes that she was not taking atorvastatin as regularly prior to her last lipid check last week.  She wonders if she will have to take all these medications for the rest of her life.   Past Medical History:  Diagnosis Date   Hypertension    Pure hypercholesterolemia 01/24/2020    No past surgical history on file.   Current Outpatient Medications  Medication Sig Dispense Refill   amLODipine-olmesartan (AZOR) 10-40 MG tablet Take 1 tablet by mouth daily. (Patient taking differently: Take 1  tablet by mouth daily. 1/2 tab daily) 90 tablet 3   atorvastatin (LIPITOR) 10 MG tablet TAKE 1 TABLET BY MOUTH EVERY DAY 90 tablet 1   Vitamin D, Ergocalciferol, (DRISDOL) 1.25 MG (50000 UNIT) CAPS capsule Take 1 capsule (50,000 Units total) by mouth every 7 (seven) days. 24 capsule 0   No current facility-administered medications for this visit.    Allergies:   Patient has no known allergies.    Social History:  The patient  reports that she has quit smoking. She has never used smokeless tobacco. She reports current alcohol use. She reports that she does not use drugs.   Family History:  The patient's family history includes Breast cancer in her sister; CAD in her brother and mother; Cancer in her mother; Heart disease in her father; Hypertension in her father and mother.    ROS:  Please see the history of present illness.   Otherwise, review of systems are positive for none.   All other systems are reviewed and negative.    PHYSICAL EXAM: VS:  BP 124/78   Pulse 72   Ht 5\' 2"  (1.575 m)   Wt 182 lb 12.8 oz (82.9 kg)   SpO2 96%   BMI 33.43 kg/m  , BMI Body mass index is 33.43 kg/m. GENERAL:  Slightly ill-appearing. HEENT: Pupils equal round and reactive, fundi not visualized, oral mucosa unremarkable NECK:  No jugular venous  distention, waveform within normal limits, carotid upstroke brisk and symmetric, no bruits LUNGS:  Clear to auscultation bilaterally HEART:  RRR.  PMI not displaced or sustained,S1 and S2 within normal limits, no S3, no S4, no clicks, no rubs, no murmurs ABD:  Flat, positive bowel sounds normal in frequency in pitch, no bruits, no rebound, no guarding, no midline pulsatile mass, no hepatomegaly, no splenomegaly EXT:  2 plus pulses throughout, no edema, no cyanosis no clubbing SKIN:  No rashes no nodules NEURO:  Cranial nerves II through XII grossly intact, motor grossly intact throughout PSYCH:  Cognitively intact, oriented to person place and time   EKG:   EKG is not ordered today. The ekg ordered 12/2019 demonstrates sinus rhythm.  Rate 61 bpm.  LVH.   Recent Labs: 10/03/2020: Hemoglobin 13.0; Platelets 426 01/22/2021: ALT 17; BUN 15; Creatinine, Ser 1.38; Potassium 5.5; Sodium 141    Lipid Panel    Component Value Date/Time   CHOL 246 (H) 01/22/2021 0919   TRIG 119 01/22/2021 0919   HDL 44 01/22/2021 0919   CHOLHDL 5.6 (H) 01/22/2021 0919   LDLCALC 180 (H) 01/22/2021 0919      Wt Readings from Last 3 Encounters:  01/29/21 182 lb 12.8 oz (82.9 kg)  01/22/21 183 lb (83 kg)  10/03/20 195 lb 9.6 oz (88.7 kg)      ASSESSMENT AND PLAN:  # Essential hypertension: Blood pressure has been much better controlled.  She is doing well on her current doses of amlodipine/olmesartan.  Renal function has been stable.  She was congratulated on her weight loss efforts, which of definitely helped her ability to control her blood pressure.  # Palpitations: Stable.  Likely PACs/PVCs.  # Hyperlipidemia : Patient stopped taking atorvastatin for a while before her last lipid check and lipids are very elevated.  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  This is especially true given that she has been successfully losing weight and working on her diet.  We discussed increasing the dose.  She would like to better understand her cardiovascular risk first.  We will get a coronary calcium score.  Based on that we will determine her LDL goal.  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  Continue with nephrology follow-up   Current medicines are reviewed at length with the patient today.  The patient does not have concerns regarding medicines.  The following changes have been made:  no change  Labs/ tests ordered today include:   No orders of the defined types were placed in this encounter.    Disposition:   FU with Majid Mccravy C. Duke Salvia, MD, [facility] in 1 year   Time spent: 6 minutes-Greater than 50% of this time was spent in  counseling, explanation of diagnosis, planning of further management, and coordination of care.  Signed, Loie Jahr C. Duke Salvia, MD, [facility]  01/29/2021 8:46 AM    [facility]

## 2021-01-29 NOTE — Patient Instructions (Signed)
Medication Instructions:  Your physician recommends that you continue on your current medications as directed. Please refer to the Current Medication list given to you today.   *If you need a refill on your cardiac medications before your next appointment, please call your pharmacy*  Lab Work: NONE   Testing/Procedures: CALCIUM SCORE - THIS WILL COST $99 OUT OF POCKET    Follow-Up: At [facility], you and your health needs are our priority.  As part of our continuing mission to provide you with exceptional heart care, we have created designated Provider Care Teams.  These Care Teams include your primary Cardiologist (physician) and Advanced Practice Providers (APPs -  Physician Assistants and Nurse Practitioners) who all work together to provide you with the care you need, when you need it.  We recommend signing up for the patient portal called "MyChart".  Sign up information is provided on this After Visit Summary.  MyChart is used to connect with patients for Virtual Visits (Telemedicine).  Patients are able to view lab/test results, encounter notes, upcoming appointments, etc.  Non-urgent messages can be sent to your provider as well.   To learn more about what you can do with MyChart, go to [email or web address].    Your next appointment:   12 month(s)  The format for your next appointment:   In Person  Provider:   Tiffany Greasy, MD    

## 2021-02-21 ENCOUNTER — Ambulatory Visit
Admission: RE | Admit: 2021-02-21 | Discharge: 2021-02-21 | Disposition: A | Discharge status: Home / Self Care | Payer: Self-pay | Source: Ambulatory Visit | Attending: Cardiovascular Disease | Admitting: Cardiovascular Disease

## 2021-02-21 ENCOUNTER — Other Ambulatory Visit: Payer: Self-pay

## 2021-02-21 DIAGNOSIS — I10 Essential (primary) hypertension: Secondary | ICD-10-CM

## 2021-02-25 ENCOUNTER — Encounter: Payer: Self-pay | Admitting: Nurse Practitioner

## 2021-03-10 ENCOUNTER — Ambulatory Visit: Payer: BC Managed Care – PPO | Admitting: Dietician

## 2021-03-27 ENCOUNTER — Telehealth (HOSPITAL_BASED_OUTPATIENT_CLINIC_OR_DEPARTMENT_OTHER): Payer: Self-pay | Admitting: Cardiovascular Disease

## 2021-03-27 NOTE — Telephone Encounter (Signed)
Follow Up:      Patient is returning your call. 

## 2021-03-28 ENCOUNTER — Encounter (HOSPITAL_BASED_OUTPATIENT_CLINIC_OR_DEPARTMENT_OTHER): Payer: Self-pay

## 2021-03-28 DIAGNOSIS — E7800 Pure hypercholesterolemia, unspecified: Secondary | ICD-10-CM

## 2021-03-28 NOTE — Telephone Encounter (Signed)
Patient returned your call.

## 2021-03-31 MED ORDER — ATORVASTATIN CALCIUM 40 MG PO TABS: 40.0000 mg | ORAL_TABLET | Freq: Every day | ORAL | 3 refills | Status: DC

## 2021-03-31 NOTE — Telephone Encounter (Signed)
Tried to call patient several times, call could not be completed at this time. Was able to call other numbers  Myhchart message sent to patient this morning

## 2021-04-13 ENCOUNTER — Other Ambulatory Visit: Payer: Self-pay | Admitting: Nurse Practitioner

## 2021-05-19 ENCOUNTER — Encounter: Payer: Self-pay | Admitting: Nurse Practitioner

## 2021-05-27 ENCOUNTER — Other Ambulatory Visit: Payer: Self-pay | Admitting: Nurse Practitioner

## 2021-05-27 ENCOUNTER — Other Ambulatory Visit: Payer: Self-pay | Admitting: Obstetrics and Gynecology

## 2021-05-27 DIAGNOSIS — Z1231 Encounter for screening mammogram for malignant neoplasm of breast: Secondary | ICD-10-CM

## 2021-06-30 ENCOUNTER — Ambulatory Visit
Admission: RE | Admit: 2021-06-30 | Discharge: 2021-06-30 | Disposition: A | Discharge status: Home / Self Care | Payer: BC Managed Care – PPO | Source: Ambulatory Visit | Attending: Obstetrics and Gynecology | Admitting: Obstetrics and Gynecology

## 2021-06-30 DIAGNOSIS — Z1231 Encounter for screening mammogram for malignant neoplasm of breast: Secondary | ICD-10-CM

## 2021-07-10 ENCOUNTER — Ambulatory Visit: Payer: BC Managed Care – PPO | Admitting: Nurse Practitioner

## 2021-07-10 ENCOUNTER — Encounter: Payer: Self-pay | Admitting: Nurse Practitioner

## 2021-07-10 ENCOUNTER — Other Ambulatory Visit: Payer: Self-pay

## 2021-07-10 VITALS — BP 122/60 | HR 64 | Temp 98.1°F | Ht 61.6 in | Wt 174.4 lb

## 2021-07-10 DIAGNOSIS — Z6832 Body mass index (BMI) 32.0-32.9, adult: Secondary | ICD-10-CM

## 2021-07-10 DIAGNOSIS — N183 Chronic kidney disease, stage 3 unspecified: Secondary | ICD-10-CM

## 2021-07-10 DIAGNOSIS — E7800 Pure hypercholesterolemia, unspecified: Secondary | ICD-10-CM | POA: Diagnosis not present

## 2021-07-10 DIAGNOSIS — Z2821 Immunization not carried out because of patient refusal: Secondary | ICD-10-CM

## 2021-07-10 DIAGNOSIS — H538 Other visual disturbances: Secondary | ICD-10-CM

## 2021-07-10 DIAGNOSIS — I129 Hypertensive chronic kidney disease with stage 1 through stage 4 chronic kidney disease, or unspecified chronic kidney disease: Secondary | ICD-10-CM

## 2021-07-10 DIAGNOSIS — E6609 Other obesity due to excess calories: Secondary | ICD-10-CM

## 2021-07-10 LAB — CMP14+EGFR
ALT: 15 IU/L (ref 0–32)
AST: 16 IU/L (ref 0–40)
Albumin/Globulin Ratio: 1.9 (ref 1.2–2.2)
Albumin: 4.6 g/dL (ref 3.8–4.8)
Alkaline Phosphatase: 97 IU/L (ref 44–121)
BUN/Creatinine Ratio: 13 (ref 12–28)
BUN: 19 mg/dL (ref 8–27)
Bilirubin Total: 0.6 mg/dL (ref 0.0–1.2)
CO2: 26 mmol/L (ref 20–29)
Calcium: 9.8 mg/dL (ref 8.7–10.3)
Chloride: 105 mmol/L (ref 96–106)
Creatinine, Ser: 1.42 mg/dL — ABNORMAL HIGH (ref 0.57–1.00)
Globulin, Total: 2.4 g/dL (ref 1.5–4.5)
Glucose: 92 mg/dL (ref 70–99)
Potassium: 5.6 mmol/L — ABNORMAL HIGH (ref 3.5–5.2)
Sodium: 143 mmol/L (ref 134–144)
Total Protein: 7 g/dL (ref 6.0–8.5)
eGFR: 41 mL/min/{1.73_m2} — ABNORMAL LOW (ref >59)

## 2021-07-10 LAB — LIPID PANEL
Chol/HDL Ratio: 2.6 ratio (ref 0.0–4.4)
Cholesterol, Total: 137 mg/dL (ref 100–199)
HDL: 52 mg/dL (ref >39)
LDL Chol Calc (NIH): 70 mg/dL (ref 0–99)
Triglycerides: 73 mg/dL (ref 0–149)
VLDL Cholesterol Cal: 15 mg/dL (ref 5–40)

## 2021-07-10 NOTE — Progress Notes (Signed)
I,[name],acting as a Education administrator for [employer], FNP.,have documented all relevant documentation on the behalf of Joanna Brine, FNP,as directed by  Joanna Brine, FNP while in the presence of Joanna Powell, [place].  This visit occurred during the SARS-CoV-2 public health emergency.  Safety protocols were in place, including screening questions prior to the visit, additional usage of staff PPE, and extensive cleaning of exam room while observing appropriate contact time as indicated for disinfecting solutions.  Subjective:     Patient ID: Joanna Powell , female    DOB: [birth year] , 66 y.o.   MRN: [id number]   Chief Complaint  Patient presents with   Hypertension    HPI  The patient is here today for a blood pressure f/u.  She states that she had some blurred vision last week but thinks it may have been from her cataracts.   Wt Readings from Last 3 Encounters: 07/10/21 : 174 lb 6.4 oz (79.1 kg) 01/29/21 : 182 lb 12.8 oz (82.9 kg) 01/22/21 : 183 lb (83 kg)  She has changed her diet and exercising more regularly - 4-5 days a week.  Hypertension This is a chronic problem. The current episode started more than 1 year ago. The problem is unchanged. The problem is controlled. Pertinent negatives include no chest pain, headaches or palpitations. There are no associated agents to hypertension. Risk factors for coronary artery disease include dyslipidemia, sedentary lifestyle and obesity. Past treatments include angiotensin blockers. Compliance problems include exercise.  There is no history of angina. There is no history of chronic renal disease.    Past Medical History:  Diagnosis Date   Hypertension    Pure hypercholesterolemia 01/24/2020     Family History  Problem Relation Age of Onset   Breast cancer Sister    Cancer Mother    Hypertension Mother    CAD Mother    Hypertension Father    Heart disease Father    CAD Brother      Current Outpatient Medications:     amLODipine-olmesartan (AZOR) 5-20 MG tablet, Take 1 tablet by mouth daily., Disp: 90 tablet, Rfl: 3   atorvastatin (LIPITOR) 40 MG tablet, Take 1 tablet (40 mg total) by mouth daily., Disp: 90 tablet, Rfl: 3   No Known Allergies   Review of Systems  Constitutional: Negative.  Negative for fatigue.  Respiratory: Negative.    Cardiovascular: Negative.  Negative for chest pain, palpitations and leg swelling.  Gastrointestinal: Negative.   Neurological: Negative.  Negative for headaches.  Psychiatric/Behavioral: Negative.      Today's Vitals   07/10/21 0906  BP: 122/60  Pulse: 64  Temp: 98.1 F (36.7 C)  TempSrc: Oral  Weight: 174 lb 6.4 oz (79.1 kg)  Height: 5' 1.6" (1.565 m)   Body mass index is 32.31 kg/m.  Wt Readings from Last 3 Encounters:  07/10/21 174 lb 6.4 oz (79.1 kg)  01/29/21 182 lb 12.8 oz (82.9 kg)  01/22/21 183 lb (83 kg)    Objective:  Physical Exam Vitals reviewed.  Constitutional:      General: She is not in acute distress.    Appearance: Normal appearance.  Cardiovascular:     Rate and Rhythm: Normal rate and regular rhythm.     Pulses: Normal pulses.     Heart sounds: Normal heart sounds. No murmur heard. Pulmonary:     Effort: Pulmonary effort is normal. No respiratory distress.     Breath sounds: Normal breath sounds. No wheezing.  Musculoskeletal:  Right lower leg: No edema.     Left lower leg: No edema.  Skin:    General: Skin is warm and dry.     Capillary Refill: Capillary refill takes less than 2 seconds.  Neurological:     General: No focal deficit present.     Mental Status: She is alert and oriented to person, place, and time.     Cranial Nerves: No cranial nerve deficit.     Motor: No weakness.  Psychiatric:        Mood and Affect: Mood normal.        Behavior: Behavior normal.        Thought Content: Thought content normal.        Judgment: Judgment normal.        Assessment And Plan:     1. Benign hypertension with  chronic kidney disease, stage III ([place]) Comments: Blood pressure is well controlled and kidney functions are stable. Continue medications and staying well hydrated with water.  - CMP14+EGFR  2. Pure hypercholesterolemia Comments: Stable, tolerating statin well.  - CMP14+EGFR - Lipid panel  3. COVID-19 vaccination declined Declines covid 19 vaccine. Discussed risk of covid 72 and if she changes her mind about the vaccine to call the office.  Encouraged to take multivitamin, vitamin d, vitamin c and zinc to increase immune system. Aware can call office if would like to have vaccine here at office.   4. Influenza vaccination declined Patient declined influenza vaccination at this time. Patient is aware that influenza vaccine prevents illness in 70% of healthy people, and reduces hospitalizations to 30-70% in elderly. This vaccine is recommended annually. Pt is willing to accept risk associated with refusing vaccination.  5. Class 1 obesity due to excess calories with serious comorbidity and body mass index (BMI) of 32.0 to 32.9 in adult Comments: Congratulated on her 8 lb weight loss, continue regular exercise and healthy diet. She is encouraged to strive for BMI less than 30 to decrease cardiac risk. Advised to aim for at least 150 minutes of exercise per week.  6. Blurred vision, bilateral None presently she has been diagnosed with cataracts and has been recommended to have removed, advised to contact opthalmology   Patient was given opportunity to ask questions. Patient verbalized understanding of the plan and was able to repeat key elements of the plan. All questions were answered to their satisfaction.  Joanna Brine, FNP   I, Joanna Brine, FNP, have reviewed all documentation for this visit. The documentation on 07/10/21 for the exam, diagnosis, procedures, and orders are all accurate and complete.   IF YOU HAVE BEEN REFERRED TO A SPECIALIST, IT MAY TAKE 1-2 WEEKS TO SCHEDULE/PROCESS THE  REFERRAL. IF YOU HAVE NOT HEARD FROM US/SPECIALIST IN TWO WEEKS, PLEASE GIVE [place] A CALL AT [phone].   THE PATIENT IS ENCOURAGED TO PRACTICE SOCIAL DISTANCING DUE TO THE COVID-19 PANDEMIC.

## 2021-09-17 ENCOUNTER — Other Ambulatory Visit: Payer: Self-pay | Admitting: Nurse Practitioner

## 2021-10-09 ENCOUNTER — Ambulatory Visit (INDEPENDENT_AMBULATORY_CARE_PROVIDER_SITE_OTHER): Payer: BC Managed Care – PPO | Admitting: Nurse Practitioner

## 2021-10-09 ENCOUNTER — Encounter: Payer: Self-pay | Admitting: Nurse Practitioner

## 2021-10-09 VITALS — BP 122/70 | HR 61 | Temp 98.3°F | Ht 62.0 in | Wt 179.0 lb

## 2021-10-09 DIAGNOSIS — E7800 Pure hypercholesterolemia, unspecified: Secondary | ICD-10-CM

## 2021-10-09 DIAGNOSIS — I700 Atherosclerosis of aorta: Secondary | ICD-10-CM

## 2021-10-09 DIAGNOSIS — N183 Chronic kidney disease, stage 3 unspecified: Secondary | ICD-10-CM | POA: Diagnosis not present

## 2021-10-09 DIAGNOSIS — I129 Hypertensive chronic kidney disease with stage 1 through stage 4 chronic kidney disease, or unspecified chronic kidney disease: Secondary | ICD-10-CM

## 2021-10-09 DIAGNOSIS — Z0000 Encounter for general adult medical examination without abnormal findings: Secondary | ICD-10-CM

## 2021-10-09 DIAGNOSIS — E6609 Other obesity due to excess calories: Secondary | ICD-10-CM

## 2021-10-09 DIAGNOSIS — Z23 Encounter for immunization: Secondary | ICD-10-CM | POA: Diagnosis not present

## 2021-10-09 DIAGNOSIS — Z79899 Other long term (current) drug therapy: Secondary | ICD-10-CM

## 2021-10-09 DIAGNOSIS — R7303 Prediabetes: Secondary | ICD-10-CM

## 2021-10-09 DIAGNOSIS — Z6832 Body mass index (BMI) 32.0-32.9, adult: Secondary | ICD-10-CM

## 2021-10-09 DIAGNOSIS — E559 Vitamin D deficiency, unspecified: Secondary | ICD-10-CM

## 2021-10-09 LAB — POCT URINALYSIS DIPSTICK
Bilirubin, UA: NEGATIVE
Blood, UA: NEGATIVE
Glucose, UA: NEGATIVE
Ketones, UA: NEGATIVE
Leukocytes, UA: NEGATIVE
Nitrite, UA: NEGATIVE
Protein, UA: NEGATIVE
Spec Grav, UA: >=1.03 — AB (ref 1.010–1.025)
Urobilinogen, UA: 0.2 E.U./dL
pH, UA: 6 (ref 5.0–8.0)

## 2021-10-09 MED ORDER — VITAMIN D (ERGOCALCIFEROL) 1.25 MG (50000 UNIT) PO CAPS: 50000.0000 [IU] | ORAL_CAPSULE | ORAL | 0 refills | Status: DC

## 2021-10-09 MED ORDER — VITAMIN D (ERGOCALCIFEROL) 1.25 MG (50000 UNIT) PO CAPS
50000.0000 [IU] | ORAL_CAPSULE | ORAL | 0 refills | Status: DC
Start: 2021-10-09 — End: 2022-02-19

## 2021-10-09 NOTE — Patient Instructions (Signed)

## 2021-10-09 NOTE — Progress Notes (Signed)
I,[name],acting as a Education administrator for [employer], FNP.,have documented all relevant documentation on the behalf of Minette Brine, FNP,as directed by  Minette Brine, FNP while in the presence of Minette Brine, [place].  This visit occurred during the SARS-CoV-2 public health emergency.  Safety protocols were in place, including screening questions prior to the visit, additional usage of staff PPE, and extensive cleaning of exam room while observing appropriate contact time as indicated for disinfecting solutions.  Subjective:     Patient ID: Joanna Powell , female    DOB: Feb 20, 1956 , 66 y.o.   MRN: [id number]   Chief Complaint  Patient presents with  . Annual Exam    HPI  Patient is here for physical exam. She is followed by [facility] for her GYN care she has an appt next month.   She has been to Nephrology since her last visit.    Wt Readings from Last 3 Encounters: 10/09/21 : 179 lb (81.2 kg) 07/10/21 : 174 lb 6.4 oz (79.1 kg) 01/29/21 : 182 lb 12.8 oz (82.9 kg)    Hypertension This is a chronic problem. The current episode started more than 1 year ago. The problem is controlled. Pertinent negatives include no chest pain, headaches, palpitations or shortness of breath. Risk factors for coronary artery disease include dyslipidemia, sedentary lifestyle and obesity. Past treatments include angiotensin blockers. Compliance problems include exercise.  There is no history of angina.    Past Medical History:  Diagnosis Date  . Hypertension   . Pure hypercholesterolemia 01/24/2020     Family History  Problem Relation Age of Onset  . Breast cancer Sister   . Cancer Mother   . Hypertension Mother   . CAD Mother   . Hypertension Father   . Heart disease Father   . CAD Brother      Current Outpatient Medications:  .  amLODipine-olmesartan (AZOR) 5-20 MG tablet, Take 1 tablet by mouth daily., Disp: 90 tablet, Rfl: 3 .  atorvastatin (LIPITOR) 40 MG tablet, Take 1 tablet  (40 mg total) by mouth daily., Disp: 90 tablet, Rfl: 3 .  Vitamin D, Ergocalciferol, (DRISDOL) 1.25 MG (50000 UNIT) CAPS capsule, Take 1 capsule (50,000 Units total) by mouth every 7 (seven) days., Disp: 12 capsule, Rfl: 0   No Known Allergies    The patient states she is post menopausal status.  No LMP recorded. Patient is postmenopausal.. Negative for Dysmenorrhea and Negative for Menorrhagia. Negative for: breast discharge, breast lump(s), breast pain and breast self exam. Associated symptoms include abnormal vaginal bleeding. Pertinent negatives include abnormal bleeding (hematology), anxiety, decreased libido, depression, difficulty falling sleep, dyspareunia, history of infertility, nocturia, sexual dysfunction, sleep disturbances, urinary incontinence, urinary urgency, vaginal discharge and vaginal itching. Diet regular; she is eating an increased amounts of fruits, salads and vegetables.  The patient states her exercise level is moderate 4 - 5 times a week, approximately 25-30 minutes.   The patient's tobacco use is:  Social History   Tobacco Use  Smoking Status Former  Smokeless Tobacco Never  She has been exposed to passive smoke. The patient's alcohol use is:  Social History   Substance and Sexual Activity  Alcohol Use Yes   Comment: occasional   Additional information: Last pap 2021, will get results from [facility] [place] [facility], next one scheduled for 2024.    Review of Systems  Constitutional: Negative.   HENT: Negative.    Eyes: Negative.   Respiratory: Negative.  Negative for shortness of breath.  Cardiovascular: Negative.  Negative for chest pain and palpitations.  Gastrointestinal: Negative.   Endocrine: Negative.   Genitourinary: Negative.   Musculoskeletal: Negative.   Skin: Negative.   Allergic/Immunologic: Negative.   Neurological: Negative.  Negative for headaches.  Hematological: Negative.   Psychiatric/Behavioral: Negative.      Today's Vitals    10/09/21 0852  BP: 122/70  Pulse: 61  Temp: 98.3 F (36.8 C)  TempSrc: Oral  Weight: 179 lb (81.2 kg)  Height: _0  (1.575 m)  PainSc: 0-No pain   Body mass index is 32.74 kg/m.  Wt Readings from Last 3 Encounters:  10/09/21 179 lb (81.2 kg)  07/10/21 174 lb 6.4 oz (79.1 kg)  01/29/21 182 lb 12.8 oz (82.9 kg)    Objective:  Physical Exam Vitals reviewed.  Constitutional:      General: She is not in acute distress.    Appearance: Normal appearance. She is well-developed. She is obese.  HENT:     Head: Normocephalic and atraumatic.     Right Ear: Hearing, tympanic membrane, ear canal and external ear normal.     Left Ear: Hearing, tympanic membrane, ear canal and external ear normal.     Nose: Nose normal.     Mouth/Throat:     Mouth: Mucous membranes are moist.  Eyes:     General: Lids are normal.        Right eye: No discharge.        Left eye: No discharge.     Extraocular Movements: Extraocular movements intact.     Pupils: Pupils are equal, round, and reactive to light.     Funduscopic exam:    Right eye: No papilledema.        Left eye: No papilledema.  Neck:     Thyroid: No thyroid mass.     Vascular: No carotid bruit.  Cardiovascular:     Rate and Rhythm: Normal rate and regular rhythm.     Pulses: Normal pulses.     Heart sounds: Normal heart sounds. No murmur heard. Pulmonary:     Effort: Pulmonary effort is normal. No respiratory distress.     Breath sounds: Normal breath sounds. No wheezing.  Abdominal:     General: Abdomen is flat. Bowel sounds are normal. There is no distension.     Palpations: Abdomen is soft.     Tenderness: There is no abdominal tenderness.  Genitourinary:    Comments: Deferred - has GYN Musculoskeletal:        General: No swelling or tenderness. Normal range of motion.     Cervical back: Full passive range of motion without pain, normal range of motion and neck supple.     Right lower leg: No edema.     Left lower leg: No  edema.  Skin:    General: Skin is warm and dry.     Capillary Refill: Capillary refill takes less than 2 seconds.  Neurological:     General: No focal deficit present.     Mental Status: She is alert and oriented to person, place, and time.     Cranial Nerves: No cranial nerve deficit.     Sensory: No sensory deficit.  Psychiatric:        Mood and Affect: Mood normal.        Behavior: Behavior normal.        Thought Content: Thought content normal.        Judgment: Judgment normal.  Assessment And Plan:     1. Health maintenance examination Behavior modifications discussed and diet history reviewed.   Pt will continue to exercise regularly and modify diet with low GI, plant based foods and decrease intake of processed foods.  Recommend intake of daily multivitamin, Vitamin D, and calcium.  Recommend mammogram and colonoscopy  for preventive screenings, as well as recommend immunizations that include influenza, TDAP, and Shingles  2. Class 1 obesity due to excess calories with serious comorbidity and body mass index (BMI) of 32.0 to 32.9 in adult She is encouraged to strive for BMI less than 30 to decrease cardiac risk. Advised to aim for at least 150 minutes of exercise per week.  3. Benign hypertension with chronic kidney disease, stage III (HCC) Comments: Blood pressure is well controlled, continue medications - EKG 12-Lead - POCT Urinalysis Dipstick (81002) - Microalbumin / Creatinine Urine Ratio - CMP14+EGFR  4. Need for Tdap vaccination - Tdap vaccine greater than or equal to 7yo IM  5. Pure hypercholesterolemia Comments: Stable, continue statin, tolerating well.  - Lipid panel  6. Atherosclerosis of aorta (HCC) Comments: Continue statin, tolerating well - Lipid panel  7. Vitamin D deficiency - VITAMIN D 25 Hydroxy (Vit-D Deficiency, Fractures) - Vitamin D, Ergocalciferol, (DRISDOL) 1.25 MG (50000 UNIT) CAPS capsule; Take 1 capsule (50,000 Units total) by  mouth every 7 (seven) days.  Dispense: 12 capsule; Refill: 0  8. Prediabetes Comments: No current medications, diet controlled.  - Hemoglobin A1c  9. Other long term (current) drug therapy - CBC      Patient was given opportunity to ask questions. Patient verbalized understanding of the plan and was able to repeat key elements of the plan. All questions were answered to their satisfaction.   Minette Brine, FNP   I, Minette Brine, FNP, have reviewed all documentation for this visit. The documentation on 10/09/21 for the exam, diagnosis, procedures, and orders are all accurate and complete.  THE PATIENT IS ENCOURAGED TO PRACTICE SOCIAL DISTANCING DUE TO THE COVID-19 PANDEMIC.

## 2021-10-10 LAB — CMP14+EGFR
ALT: 16 IU/L (ref 0–32)
AST: 18 IU/L (ref 0–40)
Albumin/Globulin Ratio: 1.7 (ref 1.2–2.2)
Albumin: 4.6 g/dL (ref 3.8–4.8)
Alkaline Phosphatase: 108 IU/L (ref 44–121)
BUN/Creatinine Ratio: 16 (ref 12–28)
BUN: 23 mg/dL (ref 8–27)
Bilirubin Total: 0.6 mg/dL (ref 0.0–1.2)
CO2: 24 mmol/L (ref 20–29)
Calcium: 9.6 mg/dL (ref 8.7–10.3)
Chloride: 103 mmol/L (ref 96–106)
Creatinine, Ser: 1.44 mg/dL — ABNORMAL HIGH (ref 0.57–1.00)
Globulin, Total: 2.7 g/dL (ref 1.5–4.5)
Glucose: 97 mg/dL (ref 70–99)
Potassium: 5.2 mmol/L (ref 3.5–5.2)
Sodium: 142 mmol/L (ref 134–144)
Total Protein: 7.3 g/dL (ref 6.0–8.5)
eGFR: 40 mL/min/{1.73_m2} — ABNORMAL LOW (ref >59)

## 2021-10-10 LAB — CBC
Hematocrit: 39.7 % (ref 34.0–46.6)
Hemoglobin: 12.9 g/dL (ref 11.1–15.9)
MCH: 26.6 pg (ref 26.6–33.0)
MCHC: 32.5 g/dL (ref 31.5–35.7)
MCV: 82 fL (ref 79–97)
Platelets: 413 10*3/uL (ref 150–450)
RBC: 4.85 x10E6/uL (ref 3.77–5.28)
RDW: 14 % (ref 11.7–15.4)
WBC: 4.7 10*3/uL (ref 3.4–10.8)

## 2021-10-10 LAB — LIPID PANEL
Chol/HDL Ratio: 2.8 ratio (ref 0.0–4.4)
Cholesterol, Total: 141 mg/dL (ref 100–199)
HDL: 50 mg/dL (ref >39)
LDL Chol Calc (NIH): 78 mg/dL (ref 0–99)
Triglycerides: 60 mg/dL (ref 0–149)
VLDL Cholesterol Cal: 13 mg/dL (ref 5–40)

## 2021-10-10 LAB — MICROALBUMIN / CREATININE URINE RATIO
Creatinine, Urine: 206.3 mg/dL
Microalb/Creat Ratio: 5 mg/g creat (ref 0–29)
Microalbumin, Urine: 11.1 ug/mL

## 2021-10-10 LAB — VITAMIN D 25 HYDROXY (VIT D DEFICIENCY, FRACTURES): Vit D, 25-Hydroxy: 25.7 ng/mL — ABNORMAL LOW (ref 30.0–100.0)

## 2021-10-10 LAB — HEMOGLOBIN A1C
Est. average glucose Bld gHb Est-mCnc: 120 mg/dL
Hgb A1c MFr Bld: 5.8 % — ABNORMAL HIGH (ref 4.8–5.6)

## 2022-02-19 ENCOUNTER — Ambulatory Visit: Payer: BC Managed Care – PPO | Admitting: Nurse Practitioner

## 2022-02-19 ENCOUNTER — Encounter: Payer: Self-pay | Admitting: Nurse Practitioner

## 2022-02-19 VITALS — BP 128/70 | HR 70 | Temp 98.4°F | Ht 62.0 in | Wt 175.6 lb

## 2022-02-19 DIAGNOSIS — R7303 Prediabetes: Secondary | ICD-10-CM

## 2022-02-19 DIAGNOSIS — E559 Vitamin D deficiency, unspecified: Secondary | ICD-10-CM | POA: Insufficient documentation

## 2022-02-19 DIAGNOSIS — I700 Atherosclerosis of aorta: Secondary | ICD-10-CM | POA: Diagnosis not present

## 2022-02-19 DIAGNOSIS — I129 Hypertensive chronic kidney disease with stage 1 through stage 4 chronic kidney disease, or unspecified chronic kidney disease: Secondary | ICD-10-CM

## 2022-02-19 DIAGNOSIS — Z2821 Immunization not carried out because of patient refusal: Secondary | ICD-10-CM

## 2022-02-19 DIAGNOSIS — N183 Chronic kidney disease, stage 3 unspecified: Secondary | ICD-10-CM | POA: Diagnosis not present

## 2022-02-19 DIAGNOSIS — E7800 Pure hypercholesterolemia, unspecified: Secondary | ICD-10-CM

## 2022-02-19 DIAGNOSIS — E6609 Other obesity due to excess calories: Secondary | ICD-10-CM

## 2022-02-19 DIAGNOSIS — Z6832 Body mass index (BMI) 32.0-32.9, adult: Secondary | ICD-10-CM

## 2022-02-19 MED ORDER — VITAMIN D (ERGOCALCIFEROL) 1.25 MG (50000 UNIT) PO CAPS: 50000.0000 [IU] | ORAL_CAPSULE | Freq: Two times a day (BID) | ORAL | 1 refills | Status: DC

## 2022-02-19 NOTE — Progress Notes (Signed)
I,[name],acting as a Education administrator for [employer], FNP.,have documented all relevant documentation on the behalf of Minette Brine, FNP,as directed by  Minette Brine, FNP while in the presence of Minette Brine, [place].  Subjective:     Patient ID: Joanna Powell , female    DOB: 01-22-1956 , 66 y.o.   MRN: [id number]   Chief Complaint  Patient presents with   Hypertension    HPI  The patient is here today for a blood pressure f/u.    Hypertension This is a chronic problem. The current episode started more than 1 year ago. The problem is unchanged. The problem is controlled. Pertinent negatives include no chest pain, headaches or palpitations. There are no associated agents to hypertension. Risk factors for coronary artery disease include dyslipidemia, sedentary lifestyle and obesity. Past treatments include angiotensin blockers. Compliance problems include exercise.  There is no history of angina. There is no history of chronic renal disease.     Past Medical History:  Diagnosis Date   Hypertension    Pure hypercholesterolemia 01/24/2020     Family History  Problem Relation Age of Onset   Breast cancer Sister    Cancer Mother    Hypertension Mother    CAD Mother    Hypertension Father    Heart disease Father    CAD Brother      Current Outpatient Medications:    amLODipine-olmesartan (AZOR) 5-20 MG tablet, Take 1 tablet by mouth daily., Disp: 90 tablet, Rfl: 3   atorvastatin (LIPITOR) 40 MG tablet, Take 1 tablet (40 mg total) by mouth daily., Disp: 90 tablet, Rfl: 3   Vitamin D, Ergocalciferol, (DRISDOL) 1.25 MG (50000 UNIT) CAPS capsule, Take 1 capsule (50,000 Units total) by mouth in the morning and at bedtime., Disp: 24 capsule, Rfl: 1   No Known Allergies   Review of Systems  Constitutional: Negative.   HENT:  Positive for ear pain.        Left ear pain  Respiratory: Negative.    Cardiovascular: Negative.  Negative for chest pain and palpitations.  Gastrointestinal:  Negative.   Neurological: Negative.  Negative for headaches.     Today's Vitals   02/19/22 0830  BP: 128/70  Pulse: 70  Temp: 98.4 F (36.9 C)  TempSrc: Oral  Weight: 175 lb 9.6 oz (79.7 kg)  Height: _0  (1.575 m)   Body mass index is 32.12 kg/m.  Wt Readings from Last 3 Encounters:  02/19/22 175 lb 9.6 oz (79.7 kg)  10/09/21 179 lb (81.2 kg)  07/10/21 174 lb 6.4 oz (79.1 kg)     Objective:  Physical Exam Vitals reviewed.  Constitutional:      General: She is not in acute distress.    Appearance: Normal appearance.  Cardiovascular:     Rate and Rhythm: Normal rate and regular rhythm.     Pulses: Normal pulses.     Heart sounds: Normal heart sounds. No murmur heard. Pulmonary:     Effort: Pulmonary effort is normal. No respiratory distress.     Breath sounds: Normal breath sounds. No wheezing.  Musculoskeletal:     Right lower leg: No edema.     Left lower leg: No edema.  Skin:    General: Skin is warm and dry.     Capillary Refill: Capillary refill takes less than 2 seconds.  Neurological:     General: No focal deficit present.     Mental Status: She is alert and oriented to person, place, and time.  Cranial Nerves: No cranial nerve deficit.     Motor: No weakness.  Psychiatric:        Mood and Affect: Mood normal.        Behavior: Behavior normal.        Thought Content: Thought content normal.        Judgment: Judgment normal.         Assessment And Plan:     1. Benign hypertension with chronic kidney disease, stage III ([place]) Comments: Blood pressure is well controlled, continue current medications - BMP8+EGFR  2. Pure hypercholesterolemia Comments: Cholesterol levels are stable. Continue current medications, tolerating well.  - BMP8+EGFR  3. Atherosclerosis of aorta (HCC) Comments: Continue statin, tolerating well.  - Lipid panel  4. Prediabetes HgbA1c is stable, continue with healthy diet low in sugar and starches.  - Hemoglobin  A1c  5. Class 1 obesity due to excess calories with serious comorbidity and body mass index (BMI) of 32.0 to 32.9 in adult Comments: Unfortunately she was not able to get the Saxenda due to shortage and struggles to lose weight with diet and exercise. Will refer to [facility]  6. Herpes zoster vaccination declined Declines shingrix, educated on disease process and is aware if he changes his mind to notify office   7. COVID-19 vaccination declined  8. Influenza vaccination declined Patient declined influenza vaccination at this time. Patient is aware that influenza vaccine prevents illness in 70% of healthy people, and reduces hospitalizations to 30-70% in elderly. This vaccine is recommended annually. Education has been provided regarding the importance of this vaccine but patient still declined. Advised may receive this vaccine at local pharmacy or Health Dept.or vaccine clinic. Aware to provide a copy of the vaccination record if obtained from local pharmacy or Health Dept.  Pt is willing to accept risk associated with refusing vaccination.   9. Vitamin D deficiency - Vitamin D, Ergocalciferol, (DRISDOL) 1.25 MG (50000 UNIT) CAPS capsule; Take 1 capsule (50,000 Units total) by mouth in the morning and at bedtime.  Dispense: 24 capsule; Refill: 1 She is encouraged to strive for BMI less than 30 to decrease cardiac risk. Advised to aim for at least 150 minutes of exercise per week.    Patient was given opportunity to ask questions. Patient verbalized understanding of the plan and was able to repeat key elements of the plan. All questions were answered to their satisfaction.   Minette Brine, FNP   I, Minette Brine, FNP, have reviewed all documentation for this visit. The documentation on 02/19/22 for the exam, diagnosis, procedures, and orders are all accurate and complete.   IF YOU HAVE BEEN REFERRED TO A SPECIALIST, IT MAY TAKE 1-2 WEEKS TO SCHEDULE/PROCESS THE REFERRAL. IF  YOU HAVE NOT HEARD FROM US/SPECIALIST IN TWO WEEKS, PLEASE GIVE [place] A CALL AT [phone].   THE PATIENT IS ENCOURAGED TO PRACTICE SOCIAL DISTANCING DUE TO THE COVID-19 PANDEMIC.

## 2022-02-19 NOTE — Patient Instructions (Signed)
Hypertension, Adult High blood pressure (hypertension) is when the force of blood pumping through the arteries is too strong. The arteries are the blood vessels that carry blood from the heart throughout the body. Hypertension forces the heart to work harder to pump blood and may cause arteries to become narrow or stiff. Untreated or uncontrolled hypertension can lead to a heart attack, heart failure, a stroke, kidney disease, and other problems. A blood pressure reading consists of a higher number over a lower number. Ideally, your blood pressure should be below 120/80. The first ("top") number is called the systolic pressure. It is a measure of the pressure in your arteries as your heart beats. The second ("bottom") number is called the diastolic pressure. It is a measure of the pressure in your arteries as the heart relaxes. What are the causes? The exact cause of this condition is not known. There are some conditions that result in high blood pressure. What increases the risk? Certain factors may make you more likely to develop high blood pressure. Some of these risk factors are under your control, including: Smoking. Not getting enough exercise or physical activity. Being overweight. Having too much fat, sugar, calories, or salt (sodium) in your diet. Drinking too much alcohol. Other risk factors include: Having a personal history of heart disease, diabetes, high cholesterol, or kidney disease. Stress. Having a family history of high blood pressure and high cholesterol. Having obstructive sleep apnea. Age. The risk increases with age. What are the signs or symptoms? High blood pressure may not cause symptoms. Very high blood pressure (hypertensive crisis) may cause: Headache. Fast or irregular heartbeats (palpitations). Shortness of breath. Nosebleed. Nausea and vomiting. Vision changes. Severe chest pain, dizziness, and seizures. How is this diagnosed? This condition is diagnosed by  measuring your blood pressure while you are seated, with your arm resting on a flat surface, your legs uncrossed, and your feet flat on the floor. The cuff of the blood pressure monitor will be placed directly against the skin of your upper arm at the level of your heart. Blood pressure should be measured at least twice using the same arm. Certain conditions can cause a difference in blood pressure between your right and left arms. If you have a high blood pressure reading during one visit or you have normal blood pressure with other risk factors, you may be asked to: Return on a different day to have your blood pressure checked again. Monitor your blood pressure at home for 1 week or longer. If you are diagnosed with hypertension, you may have other blood or imaging tests to help your health care provider understand your overall risk for other conditions. How is this treated? This condition is treated by making healthy lifestyle changes, such as eating healthy foods, exercising more, and reducing your alcohol intake. You may be referred for counseling on a healthy diet and physical activity. Your health care provider may prescribe medicine if lifestyle changes are not enough to get your blood pressure under control and if: Your systolic blood pressure is above 130. Your diastolic blood pressure is above 80. Your personal target blood pressure may vary depending on your medical conditions, your age, and other factors. Follow these instructions at home: Eating and drinking  Eat a diet that is high in fiber and potassium, and low in sodium, added sugar, and fat. An example of this eating plan is called the DASH diet. DASH stands for Dietary Approaches to Stop Hypertension. To eat this way: Eat   plenty of fresh fruits and vegetables. Try to fill one half of your plate at each meal with fruits and vegetables. Eat whole grains, such as whole-wheat pasta, brown rice, or whole-grain bread. Fill about one  fourth of your plate with whole grains. Eat or drink low-fat dairy products, such as skim milk or low-fat yogurt. Avoid fatty cuts of meat, processed or cured meats, and poultry with skin. Fill about one fourth of your plate with lean proteins, such as fish, chicken without skin, beans, eggs, or tofu. Avoid pre-made and processed foods. These tend to be higher in sodium, added sugar, and fat. Reduce your daily sodium intake. Many people with hypertension should eat less than 1,500 mg of sodium a day. Do not drink alcohol if: Your health care provider tells you not to drink. You are pregnant, may be pregnant, or are planning to become pregnant. If you drink alcohol: Limit how much you have to: 0-1 drink a day for women. 0-2 drinks a day for men. Know how much alcohol is in your drink. In the [place] one drink equals one 12 oz bottle of beer (355 mL), one 5 oz glass of wine (148 mL), or one 1 oz glass of hard liquor (44 mL). Lifestyle  Work with your health care provider to maintain a healthy body weight or to lose weight. Ask what an ideal weight is for you. Get at least 30 minutes of exercise that causes your heart to beat faster (aerobic exercise) most days of the week. Activities may include walking, swimming, or biking. Include exercise to strengthen your muscles (resistance exercise), such as Pilates or lifting weights, as part of your weekly exercise routine. Try to do these types of exercises for 30 minutes at least 3 days a week. Do not use any products that contain nicotine or tobacco. These products include cigarettes, chewing tobacco, and vaping devices, such as e-cigarettes. If you need help quitting, ask your health care provider. Monitor your blood pressure at home as told by your health care provider. Keep all follow-up visits. This is important. Medicines Take over-the-counter and prescription medicines only as told by your health care provider. Follow directions carefully. Blood  pressure medicines must be taken as prescribed. Do not skip doses of blood pressure medicine. Doing this puts you at risk for problems and can make the medicine less effective. Ask your health care provider about side effects or reactions to medicines that you should watch for. Contact a health care provider if you: Think you are having a reaction to a medicine you are taking. Have headaches that keep coming back (recurring). Feel dizzy. Have swelling in your ankles. Have trouble with your vision. Get help right away if you: Develop a severe headache or confusion. Have unusual weakness or numbness. Feel faint. Have severe pain in your chest or abdomen. Vomit repeatedly. Have trouble breathing. These symptoms may be an emergency. Get help right away. Call 911. Do not wait to see if the symptoms will go away. Do not drive yourself to the hospital. Summary Hypertension is when the force of blood pumping through your arteries is too strong. If this condition is not controlled, it may put you at risk for serious complications. Your personal target blood pressure may vary depending on your medical conditions, your age, and other factors. For most people, a normal blood pressure is less than 120/80. Hypertension is treated with lifestyle changes, medicines, or a combination of both. Lifestyle changes include losing weight, eating a healthy,   low-sodium diet, exercising more, and limiting alcohol. This information is not intended to replace advice given to you by your health care provider. Make sure you discuss any questions you have with your health care provider. Document Revised: 03/25/2021 Document Reviewed: 03/25/2021 Elsevier Patient Education  2023 Elsevier Inc.  

## 2022-02-20 LAB — BMP8+EGFR
BUN/Creatinine Ratio: 14 (ref 12–28)
BUN: 20 mg/dL (ref 8–27)
CO2: 25 mmol/L (ref 20–29)
Calcium: 9.9 mg/dL (ref 8.7–10.3)
Chloride: 105 mmol/L (ref 96–106)
Creatinine, Ser: 1.46 mg/dL — ABNORMAL HIGH (ref 0.57–1.00)
Glucose: 90 mg/dL (ref 70–99)
Potassium: 4.9 mmol/L (ref 3.5–5.2)
Sodium: 144 mmol/L (ref 134–144)
eGFR: 39 mL/min/{1.73_m2} — ABNORMAL LOW (ref >59)

## 2022-02-20 LAB — LIPID PANEL
Chol/HDL Ratio: 3.1 ratio (ref 0.0–4.4)
Cholesterol, Total: 180 mg/dL (ref 100–199)
HDL: 59 mg/dL (ref >39)
LDL Chol Calc (NIH): 104 mg/dL — ABNORMAL HIGH (ref 0–99)
Triglycerides: 94 mg/dL (ref 0–149)
VLDL Cholesterol Cal: 17 mg/dL (ref 5–40)

## 2022-02-20 LAB — HEMOGLOBIN A1C
Est. average glucose Bld gHb Est-mCnc: 123 mg/dL
Hgb A1c MFr Bld: 5.9 % — ABNORMAL HIGH (ref 4.8–5.6)

## 2022-03-07 ENCOUNTER — Other Ambulatory Visit (HOSPITAL_BASED_OUTPATIENT_CLINIC_OR_DEPARTMENT_OTHER): Payer: Self-pay | Admitting: Cardiovascular Disease

## 2022-03-09 NOTE — Telephone Encounter (Signed)
Rx(s) sent to pharmacy electronically.  

## 2022-03-26 ENCOUNTER — Other Ambulatory Visit (HOSPITAL_BASED_OUTPATIENT_CLINIC_OR_DEPARTMENT_OTHER): Payer: Self-pay | Admitting: Cardiovascular Disease

## 2022-03-26 NOTE — Telephone Encounter (Signed)
Rx(s) sent to pharmacy electronically.  

## 2022-04-03 ENCOUNTER — Other Ambulatory Visit (HOSPITAL_BASED_OUTPATIENT_CLINIC_OR_DEPARTMENT_OTHER): Payer: Self-pay | Admitting: Cardiovascular Disease

## 2022-04-03 NOTE — Telephone Encounter (Signed)
Rx request sent to pharmacy.  

## 2022-04-10 ENCOUNTER — Encounter (HOSPITAL_BASED_OUTPATIENT_CLINIC_OR_DEPARTMENT_OTHER): Payer: Self-pay | Admitting: Cardiovascular Disease

## 2022-04-10 ENCOUNTER — Ambulatory Visit (HOSPITAL_BASED_OUTPATIENT_CLINIC_OR_DEPARTMENT_OTHER): Payer: BC Managed Care – PPO | Admitting: Cardiovascular Disease

## 2022-04-10 VITALS — BP 128/76 | HR 60 | Ht 62.0 in | Wt 177.0 lb

## 2022-04-10 DIAGNOSIS — I700 Atherosclerosis of aorta: Secondary | ICD-10-CM

## 2022-04-10 DIAGNOSIS — E7800 Pure hypercholesterolemia, unspecified: Secondary | ICD-10-CM

## 2022-04-10 DIAGNOSIS — I10 Essential (primary) hypertension: Secondary | ICD-10-CM | POA: Diagnosis not present

## 2022-04-10 MED ORDER — AMLODIPINE-OLMESARTAN 5-20 MG PO TABS: 1.0000 | ORAL_TABLET | Freq: Every day | ORAL | 3 refills | Status: DC

## 2022-04-10 MED ORDER — ATORVASTATIN CALCIUM 40 MG PO TABS: 40.0000 mg | ORAL_TABLET | Freq: Every day | ORAL | 3 refills | Status: DC

## 2022-04-10 NOTE — Patient Instructions (Signed)
Medication Instructions:  Your Physician recommend you continue on your current medication as directed.    We have refilled your Atorvastatin and Amlodipine- Olmesartan today!  *If you need a refill on your cardiac medications before your next appointment, please call your pharmacy*   Lab Work: Please return for Lab work for fasting lipid panel and CMP in 3 months. You may come to the...   [facility] (3rd floor) [address], [place], [place] [address]  Open: 8am-Noon and 1pm-4:30pm  Please ring the doorbell on the small table when you exit the elevator and the Lab Tech will come get you  [facility] at [facility] [address], [place], [place] [address] Open: 8am-1pm, then 2pm-4:30pm   [facility]- Please see attached locations sheet stapled to your lab work with address and hours.   If you have labs (blood work) drawn today and your tests are completely normal, you will receive your results only by: MyChart Message (if you have MyChart) OR A paper copy in the mail If you have any lab test that is abnormal or we need to change your treatment, we will call you to review the results.  Follow-Up: At [facility], you and your health needs are our priority.  As part of our continuing mission to provide you with exceptional heart care, we have created designated Provider Care Teams.  These Care Teams include your primary Cardiologist (physician) and Advanced Practice Providers (APPs -  Physician Assistants and Nurse Practitioners) who all work together to provide you with the care you need, when you need it.  We recommend signing up for the patient portal called "MyChart".  Sign up information is provided on this After Visit Summary.  MyChart is used to connect with patients for Virtual Visits (Telemedicine).  Patients are able to view lab/test results, encounter notes, upcoming appointments, etc.  Non-urgent messages can be sent to  your provider as well.   To learn more about what you can do with MyChart, go to [email or web address].    Your next appointment:   1 year(s)  The format for your next appointment:   In Person  Provider:   Chilton Si, MD

## 2022-04-10 NOTE — Assessment & Plan Note (Signed)
Lipids are above goal.  She notes that her diet has been poor lately.  She wants to work on this prior to increasing her atorvastatin.  We will repeat labs in 3 months.  If her lipids remain uncontrolled with an LDL over 70 will need to increase the atorvastatin at that time.

## 2022-04-10 NOTE — Progress Notes (Signed)
Cardiology Office Note:   Date:  04/10/2022   ID:  Joanna Powell, DOB 01-31-1956, MRN [id number]  PCP:  Arnette Felts, FNP  Cardiologist:   Chilton Si, MD   No chief complaint on file.  History of Present Illness: Joanna Powell is a 66 y.o. female with hypertension, hyperlipidemia and prior tobacco abuse here for follow up.  She was initially seen 12/2019.  At that time she wanted to work on diet and exercise.  She was also started on amlodipine.  The dose was subsequently increased with our pharmacist.  She was initially diagnosed with HTN may years ago.  She was initially on valsartan and switched to olmesartan due to the recall.  At her last appointment her blood pressure was improving and she will continue to work on diet and exercise.  She last saw her PCP last week and was congratulated for weight loss.  Her blood pressure at that appointment was 126/88.  She followed up with her nephrologist and they reduced her amlodipine/olmesartan to 5/20 and her blood pressures remained controlled. She held her statin and her lipids became very elevated. She got a coronary calcium score 01/2021 with a score of 295 which was 94th percentile. We recommended that she resume and increase her statin.   Today, she says she has been feeling good. Her blood pressure is normally around 116 systolic when checked. She endorses that recently she has been enjoying more cookies, chips, and unhealthier foods, which she believes affected her blood pressure today of 150/78. Upon recheck, her blood pressure was 128/76. In regards to exercise, she has been going to the gym 3-4 times per week and performing cardio and weights primarily. She has been feeling well with this activity. She denies any palpitations, chest pain, shortness of breath, or peripheral edema. No lightheadedness, headaches, syncope, orthopnea, or PND.  Past Medical History:  Diagnosis Date   Hypertension    Pure hypercholesterolemia 01/24/2020     History reviewed. No pertinent surgical history.   Current Outpatient Medications  Medication Sig Dispense Refill   Vitamin D, Ergocalciferol, (DRISDOL) 1.25 MG (50000 UNIT) CAPS capsule Take 1 capsule (50,000 Units total) by mouth in the morning and at bedtime. (Patient taking differently: Take 50,000 Units by mouth 2 (two) times a week.) 24 capsule 1   amLODipine-olmesartan (AZOR) 5-20 MG tablet Take 1 tablet by mouth daily. 90 tablet 3   atorvastatin (LIPITOR) 40 MG tablet Take 1 tablet (40 mg total) by mouth daily. NEED APPOINTMENT 90 tablet 3   No current facility-administered medications for this visit.    Allergies:   Patient has no known allergies.    Social History:  The patient  reports that she has quit smoking. She has never used smokeless tobacco. She reports current alcohol use. She reports that she does not use drugs.   Family History:  The patient's family history includes Breast cancer in her sister; CAD in her brother and mother; Cancer in her mother; Heart disease in her father; Hypertension in her father and mother.    ROS:   Please see the history of present illness.     All other systems are reviewed and negative.    PHYSICAL EXAM: VS:  BP 128/76 (BP Location: Right Arm, Patient Position: Sitting, Cuff Size: Normal)   Pulse 60   Ht 5\' 2"  (1.575 m)   Wt 177 lb (80.3 kg)   BMI 32.37 kg/m  , BMI Body mass index is 32.37 kg/m. GENERAL:  Well appearing  HEENT: Pupils equal round and reactive, fundi not visualized, oral mucosa unremarkable NECK:  No jugular venous distention, waveform within normal limits, carotid upstroke brisk and symmetric, no bruits, no thyromegaly LUNGS:  Clear to auscultation bilaterally HEART:  RRR.  PMI not displaced or sustained,S1 and S2 within normal limits, no S3, no S4, no clicks, no rubs, no murmurs ABD:  Flat, positive bowel sounds normal in frequency in pitch, no bruits, no rebound, no guarding, no midline pulsatile mass, no  hepatomegaly, no splenomegaly EXT:  2 plus pulses throughout, no edema, no cyanosis no clubbing SKIN:  No rashes no nodules NEURO:  Cranial nerves II through XII grossly intact, motor grossly intact throughout PSYCH:  Cognitively intact, oriented to person place and time  EKG: EKG is personally reviewed. 04/10/22: EKG was not ordered. 12/2019: sinus rhythm.  Rate 61 bpm.  LVH.   Recent Labs: 10/09/2021: ALT 16; Hemoglobin 12.9; Platelets 413 02/19/2022: BUN 20; Creatinine, Ser 1.46; Potassium 4.9; Sodium 144    Lipid Panel    Component Value Date/Time   CHOL 180 02/19/2022 0928   TRIG 94 02/19/2022 0928   HDL 59 02/19/2022 0928   CHOLHDL 3.1 02/19/2022 0928   LDLCALC 104 (H) 02/19/2022 0928      Wt Readings from Last 3 Encounters:  04/10/22 177 lb (80.3 kg)  02/19/22 175 lb 9.6 oz (79.7 kg)  10/09/21 179 lb (81.2 kg)    ASSESSMENT AND PLAN:  Essential hypertension Blood pressure was initially elevated but better on repeat.  She inquired about getting off of her amlodipine.  Given that her blood pressures are typically in the 120s, would not advise this at this time.  She is doing a good job with exercise and was congratulated for that.  Recommend that she be more strict with her sodium intake.  With reduced sodium is possible her blood pressures will get lower we could stop amlodipine, but not now.  Continue current regimen.  Pure hypercholesterolemia Lipids are above goal.  She notes that her diet has been poor lately.  She wants to work on this prior to increasing her atorvastatin.  We will repeat labs in 3 months.  If her lipids remain uncontrolled with an LDL over 70 will need to increase the atorvastatin at that time.  Atherosclerosis of aorta (HCC) LDL goal less than 70 as above.   Current medicines are reviewed at length with the patient today.  The patient does not have concerns regarding medicines.  The following changes have been made:  no change  Labs/ tests  ordered today include:   Orders Placed This Encounter  Procedures   Lipid panel   Comprehensive metabolic panel    Disposition:  FU with Joanna Powell C. Duke Salvia, MD, [facility] in 1 year.   I,Breanna Adamick,acting as a scribe for Chilton Si, MD.,have documented all relevant documentation on the behalf of Chilton Si, MD,as directed by  Chilton Si, MD while in the presence of Chilton Si, MD.  I, Cella Cappello C. Duke Salvia, MD have reviewed all documentation for this visit.  The documentation of the exam, diagnosis, procedures, and orders on 04/10/2022 are all accurate and complete.   Signed, Kira Hartl C. Duke Salvia, MD, [facility]  04/10/2022 10:27 AM    [facility]

## 2022-04-10 NOTE — Assessment & Plan Note (Signed)
Blood pressure was initially elevated but better on repeat.  She inquired about getting off of her amlodipine.  Given that her blood pressures are typically in the 120s, would not advise this at this time.  She is doing a good job with exercise and was congratulated for that.  Recommend that she be more strict with her sodium intake.  With reduced sodium is possible her blood pressures will get lower we could stop amlodipine, but not now.  Continue current regimen.

## 2022-04-10 NOTE — Assessment & Plan Note (Signed)
LDL goal less than 70 as above.

## 2022-04-18 ENCOUNTER — Other Ambulatory Visit: Payer: Self-pay | Admitting: Nurse Practitioner

## 2022-04-18 DIAGNOSIS — E559 Vitamin D deficiency, unspecified: Secondary | ICD-10-CM

## 2022-05-20 ENCOUNTER — Other Ambulatory Visit: Payer: Self-pay | Admitting: Obstetrics and Gynecology

## 2022-05-20 DIAGNOSIS — Z1231 Encounter for screening mammogram for malignant neoplasm of breast: Secondary | ICD-10-CM

## 2022-06-24 ENCOUNTER — Encounter: Payer: Self-pay | Admitting: Nurse Practitioner

## 2022-06-24 ENCOUNTER — Ambulatory Visit: Payer: BC Managed Care – PPO | Admitting: Nurse Practitioner

## 2022-06-24 VITALS — BP 128/72 | HR 62 | Temp 98.4°F | Wt 177.0 lb

## 2022-06-24 DIAGNOSIS — I129 Hypertensive chronic kidney disease with stage 1 through stage 4 chronic kidney disease, or unspecified chronic kidney disease: Secondary | ICD-10-CM | POA: Diagnosis not present

## 2022-06-24 DIAGNOSIS — N183 Chronic kidney disease, stage 3 unspecified: Secondary | ICD-10-CM | POA: Diagnosis not present

## 2022-06-24 DIAGNOSIS — R7303 Prediabetes: Secondary | ICD-10-CM

## 2022-06-24 DIAGNOSIS — I700 Atherosclerosis of aorta: Secondary | ICD-10-CM | POA: Diagnosis not present

## 2022-06-24 DIAGNOSIS — E7800 Pure hypercholesterolemia, unspecified: Secondary | ICD-10-CM

## 2022-06-24 DIAGNOSIS — E559 Vitamin D deficiency, unspecified: Secondary | ICD-10-CM

## 2022-06-24 NOTE — Patient Instructions (Signed)
Hypertension, Adult High blood pressure (hypertension) is when the force of blood pumping through the arteries is too strong. The arteries are the blood vessels that carry blood from the heart throughout the body. Hypertension forces the heart to work harder to pump blood and may cause arteries to become narrow or stiff. Untreated or uncontrolled hypertension can lead to a heart attack, heart failure, a stroke, kidney disease, and other problems. A blood pressure reading consists of a higher number over a lower number. Ideally, your blood pressure should be below 120/80. The first ("top") number is called the systolic pressure. It is a measure of the pressure in your arteries as your heart beats. The second ("bottom") number is called the diastolic pressure. It is a measure of the pressure in your arteries as the heart relaxes. What are the causes? The exact cause of this condition is not known. There are some conditions that result in high blood pressure. What increases the risk? Certain factors may make you more likely to develop high blood pressure. Some of these risk factors are under your control, including: Smoking. Not getting enough exercise or physical activity. Being overweight. Having too much fat, sugar, calories, or salt (sodium) in your diet. Drinking too much alcohol. Other risk factors include: Having a personal history of heart disease, diabetes, high cholesterol, or kidney disease. Stress. Having a family history of high blood pressure and high cholesterol. Having obstructive sleep apnea. Age. The risk increases with age. What are the signs or symptoms? High blood pressure may not cause symptoms. Very high blood pressure (hypertensive crisis) may cause: Headache. Fast or irregular heartbeats (palpitations). Shortness of breath. Nosebleed. Nausea and vomiting. Vision changes. Severe chest pain, dizziness, and seizures. How is this diagnosed? This condition is diagnosed by  measuring your blood pressure while you are seated, with your arm resting on a flat surface, your legs uncrossed, and your feet flat on the floor. The cuff of the blood pressure monitor will be placed directly against the skin of your upper arm at the level of your heart. Blood pressure should be measured at least twice using the same arm. Certain conditions can cause a difference in blood pressure between your right and left arms. If you have a high blood pressure reading during one visit or you have normal blood pressure with other risk factors, you may be asked to: Return on a different day to have your blood pressure checked again. Monitor your blood pressure at home for 1 week or longer. If you are diagnosed with hypertension, you may have other blood or imaging tests to help your health care provider understand your overall risk for other conditions. How is this treated? This condition is treated by making healthy lifestyle changes, such as eating healthy foods, exercising more, and reducing your alcohol intake. You may be referred for counseling on a healthy diet and physical activity. Your health care provider may prescribe medicine if lifestyle changes are not enough to get your blood pressure under control and if: Your systolic blood pressure is above 130. Your diastolic blood pressure is above 80. Your personal target blood pressure may vary depending on your medical conditions, your age, and other factors. Follow these instructions at home: Eating and drinking  Eat a diet that is high in fiber and potassium, and low in sodium, added sugar, and fat. An example of this eating plan is called the DASH diet. DASH stands for Dietary Approaches to Stop Hypertension. To eat this way: Eat   plenty of fresh fruits and vegetables. Try to fill one half of your plate at each meal with fruits and vegetables. Eat whole grains, such as whole-wheat pasta, brown rice, or whole-grain bread. Fill about one  fourth of your plate with whole grains. Eat or drink low-fat dairy products, such as skim milk or low-fat yogurt. Avoid fatty cuts of meat, processed or cured meats, and poultry with skin. Fill about one fourth of your plate with lean proteins, such as fish, chicken without skin, beans, eggs, or tofu. Avoid pre-made and processed foods. These tend to be higher in sodium, added sugar, and fat. Reduce your daily sodium intake. Many people with hypertension should eat less than 1,500 mg of sodium a day. Do not drink alcohol if: Your health care provider tells you not to drink. You are pregnant, may be pregnant, or are planning to become pregnant. If you drink alcohol: Limit how much you have to: 0-1 drink a day for women. 0-2 drinks a day for men. Know how much alcohol is in your drink. In the [place] one drink equals one 12 oz bottle of beer (355 mL), one 5 oz glass of wine (148 mL), or one 1 oz glass of hard liquor (44 mL). Lifestyle  Work with your health care provider to maintain a healthy body weight or to lose weight. Ask what an ideal weight is for you. Get at least 30 minutes of exercise that causes your heart to beat faster (aerobic exercise) most days of the week. Activities may include walking, swimming, or biking. Include exercise to strengthen your muscles (resistance exercise), such as Pilates or lifting weights, as part of your weekly exercise routine. Try to do these types of exercises for 30 minutes at least 3 days a week. Do not use any products that contain nicotine or tobacco. These products include cigarettes, chewing tobacco, and vaping devices, such as e-cigarettes. If you need help quitting, ask your health care provider. Monitor your blood pressure at home as told by your health care provider. Keep all follow-up visits. This is important. Medicines Take over-the-counter and prescription medicines only as told by your health care provider. Follow directions carefully. Blood  pressure medicines must be taken as prescribed. Do not skip doses of blood pressure medicine. Doing this puts you at risk for problems and can make the medicine less effective. Ask your health care provider about side effects or reactions to medicines that you should watch for. Contact a health care provider if you: Think you are having a reaction to a medicine you are taking. Have headaches that keep coming back (recurring). Feel dizzy. Have swelling in your ankles. Have trouble with your vision. Get help right away if you: Develop a severe headache or confusion. Have unusual weakness or numbness. Feel faint. Have severe pain in your chest or abdomen. Vomit repeatedly. Have trouble breathing. These symptoms may be an emergency. Get help right away. Call 911. Do not wait to see if the symptoms will go away. Do not drive yourself to the hospital. Summary Hypertension is when the force of blood pumping through your arteries is too strong. If this condition is not controlled, it may put you at risk for serious complications. Your personal target blood pressure may vary depending on your medical conditions, your age, and other factors. For most people, a normal blood pressure is less than 120/80. Hypertension is treated with lifestyle changes, medicines, or a combination of both. Lifestyle changes include losing weight, eating a healthy,   low-sodium diet, exercising more, and limiting alcohol. This information is not intended to replace advice given to you by your health care provider. Make sure you discuss any questions you have with your health care provider. Document Revised: 03/25/2021 Document Reviewed: 03/25/2021 Elsevier Patient Education  2023 Elsevier Inc.  

## 2022-06-24 NOTE — Progress Notes (Signed)
I,[name],acting as a Education administrator for [employer], FNP.,have documented all relevant documentation on the behalf of Minette Brine, FNP,as directed by  Minette Brine, FNP while in the presence of Minette Brine, [place].  Subjective:     Patient ID: Joanna Powell , female    DOB: 07-26-1955 , 67 y.o.   MRN: [id number]   Chief Complaint  Patient presents with   Hypertension    HPI  The patient is here today for a blood pressure f/u.    Wt Readings from Last 3 Encounters: 06/24/22 : 177 lb (80.3 kg) 04/10/22 : 177 lb (80.3 kg) 02/19/22 : 175 lb 9.6 oz (79.7 kg)  She is trying to exercise at least 3 times a week.   Hypertension This is a chronic problem. The current episode started more than 1 year ago. The problem is unchanged. The problem is controlled. Pertinent negatives include no chest pain, headaches or palpitations. There are no associated agents to hypertension. Risk factors for coronary artery disease include dyslipidemia, sedentary lifestyle and obesity. Past treatments include angiotensin blockers. Compliance problems include exercise.  There is no history of angina. There is no history of chronic renal disease.     Past Medical History:  Diagnosis Date   Hypertension    Pure hypercholesterolemia 01/24/2020     Family History  Problem Relation Age of Onset   Breast cancer Sister    Cancer Mother    Hypertension Mother    CAD Mother    Hypertension Father    Heart disease Father    CAD Brother      Current Outpatient Medications:    amLODipine-olmesartan (AZOR) 5-20 MG tablet, Take 1 tablet by mouth daily., Disp: 90 tablet, Rfl: 3   atorvastatin (LIPITOR) 40 MG tablet, Take 1 tablet (40 mg total) by mouth daily. NEED APPOINTMENT, Disp: 90 tablet, Rfl: 3   Vitamin D, Ergocalciferol, (DRISDOL) 1.25 MG (50000 UNIT) CAPS capsule, Take 1 capsule (50,000 Units total) by mouth 2 (two) times a week., Disp: 12 capsule, Rfl: 1   No Known Allergies   Review of Systems   Constitutional: Negative.   Respiratory: Negative.    Cardiovascular: Negative.  Negative for chest pain and palpitations.  Gastrointestinal: Negative.   Neurological: Negative.  Negative for headaches.  Psychiatric/Behavioral: Negative.       Today's Vitals   06/24/22 0835  BP: 128/72  Pulse: 62  Temp: 98.4 F (36.9 C)  TempSrc: Oral  Weight: 177 lb (80.3 kg)   Body mass index is 32.37 kg/m.   Objective:  Physical Exam Vitals reviewed.  Constitutional:      General: She is not in acute distress.    Appearance: Normal appearance. She is obese.  Cardiovascular:     Rate and Rhythm: Normal rate and regular rhythm.     Pulses: Normal pulses.     Heart sounds: Normal heart sounds. No murmur heard. Pulmonary:     Effort: Pulmonary effort is normal. No respiratory distress.     Breath sounds: Normal breath sounds. No wheezing.  Musculoskeletal:     Right lower leg: No edema.     Left lower leg: No edema.  Skin:    General: Skin is warm and dry.     Capillary Refill: Capillary refill takes less than 2 seconds.  Neurological:     General: No focal deficit present.     Mental Status: She is alert and oriented to person, place, and time.     Cranial Nerves: No  cranial nerve deficit.     Motor: No weakness.  Psychiatric:        Mood and Affect: Mood normal.        Behavior: Behavior normal.        Thought Content: Thought content normal.        Judgment: Judgment normal.         Assessment And Plan:     1. Benign hypertension with chronic kidney disease, stage III ([place]) Comments: Blood pressure is controlled. Continue current medications.  2. Pure hypercholesterolemia Comments: Stable, continue statin, tolerating well - Lipid panel - BMP8+EGFR  3. Atherosclerosis of aorta (HCC) Comments: Continue statin, tolerating well. - Lipid panel  4. Prediabetes Comments: HgbA1c is stable at 5.9, continue focusing on diet low in sugar and carbohydrates. Avoid white  breads, pastas, rices and potatoes - Hemoglobin A1c  5. Vitamin D deficiency Will recheck vitamin d to see if she can take the over the counter vitamin d Will check vitamin D level and supplement as needed.    Also encouraged to spend 15 minutes in the sun daily.  - VITAMIN D 25 Hydroxy (Vit-D Deficiency, Fractures)    Patient was given opportunity to ask questions. Patient verbalized understanding of the plan and was able to repeat key elements of the plan. All questions were answered to their satisfaction.  Minette Brine, FNP   I, Minette Brine, FNP, have reviewed all documentation for this visit. The documentation on 06/24/22 for the exam, diagnosis, procedures, and orders are all accurate and complete.   IF YOU HAVE BEEN REFERRED TO A SPECIALIST, IT MAY TAKE 1-2 WEEKS TO SCHEDULE/PROCESS THE REFERRAL. IF YOU HAVE NOT HEARD FROM US/SPECIALIST IN TWO WEEKS, PLEASE GIVE [place] A CALL AT [phone].   THE PATIENT IS ENCOURAGED TO PRACTICE SOCIAL DISTANCING DUE TO THE COVID-19 PANDEMIC.

## 2022-06-25 LAB — BMP8+EGFR
BUN/Creatinine Ratio: 13 (ref 12–28)
BUN: 22 mg/dL (ref 8–27)
CO2: 25 mmol/L (ref 20–29)
Calcium: 9.9 mg/dL (ref 8.7–10.3)
Chloride: 102 mmol/L (ref 96–106)
Creatinine, Ser: 1.66 mg/dL — ABNORMAL HIGH (ref 0.57–1.00)
Glucose: 87 mg/dL (ref 70–99)
Potassium: 5.1 mmol/L (ref 3.5–5.2)
Sodium: 142 mmol/L (ref 134–144)
eGFR: 34 mL/min/{1.73_m2} — ABNORMAL LOW (ref >59)

## 2022-06-25 LAB — VITAMIN D 25 HYDROXY (VIT D DEFICIENCY, FRACTURES): Vit D, 25-Hydroxy: 130 ng/mL — ABNORMAL HIGH (ref 30.0–100.0)

## 2022-06-25 LAB — LIPID PANEL
Chol/HDL Ratio: 2.6 ratio (ref 0.0–4.4)
Cholesterol, Total: 144 mg/dL (ref 100–199)
HDL: 56 mg/dL (ref >39)
LDL Chol Calc (NIH): 74 mg/dL (ref 0–99)
Triglycerides: 71 mg/dL (ref 0–149)
VLDL Cholesterol Cal: 14 mg/dL (ref 5–40)

## 2022-06-25 LAB — HEMOGLOBIN A1C
Est. average glucose Bld gHb Est-mCnc: 128 mg/dL
Hgb A1c MFr Bld: 6.1 % — ABNORMAL HIGH (ref 4.8–5.6)

## 2022-07-01 ENCOUNTER — Other Ambulatory Visit: Payer: Self-pay | Admitting: Nurse Practitioner

## 2022-07-01 DIAGNOSIS — E559 Vitamin D deficiency, unspecified: Secondary | ICD-10-CM

## 2022-07-13 ENCOUNTER — Ambulatory Visit
Admission: RE | Admit: 2022-07-13 | Discharge: 2022-07-13 | Disposition: A | Discharge status: Home / Self Care | Payer: BC Managed Care – PPO | Source: Ambulatory Visit | Attending: Obstetrics and Gynecology | Admitting: Obstetrics and Gynecology

## 2022-07-13 DIAGNOSIS — Z1231 Encounter for screening mammogram for malignant neoplasm of breast: Secondary | ICD-10-CM

## 2022-07-28 LAB — LIPID PANEL
Chol/HDL Ratio: 2.6 ratio (ref 0.0–4.4)
Cholesterol, Total: 162 mg/dL (ref 100–199)
HDL: 63 mg/dL (ref >39)
LDL Chol Calc (NIH): 85 mg/dL (ref 0–99)
Triglycerides: 71 mg/dL (ref 0–149)
VLDL Cholesterol Cal: 14 mg/dL (ref 5–40)

## 2022-07-28 LAB — COMPREHENSIVE METABOLIC PANEL
ALT: 25 IU/L (ref 0–32)
AST: 32 IU/L (ref 0–40)
Albumin/Globulin Ratio: 1.7 (ref 1.2–2.2)
Albumin: 4.6 g/dL (ref 3.9–4.9)
Alkaline Phosphatase: 100 IU/L (ref 44–121)
BUN/Creatinine Ratio: 12 (ref 12–28)
BUN: 18 mg/dL (ref 8–27)
Bilirubin Total: 0.4 mg/dL (ref 0.0–1.2)
CO2: 26 mmol/L (ref 20–29)
Calcium: 9.6 mg/dL (ref 8.7–10.3)
Chloride: 105 mmol/L (ref 96–106)
Creatinine, Ser: 1.46 mg/dL — ABNORMAL HIGH (ref 0.57–1.00)
Globulin, Total: 2.7 g/dL (ref 1.5–4.5)
Glucose: 102 mg/dL — ABNORMAL HIGH (ref 70–99)
Potassium: 4.6 mmol/L (ref 3.5–5.2)
Sodium: 144 mmol/L (ref 134–144)
Total Protein: 7.3 g/dL (ref 6.0–8.5)
eGFR: 39 mL/min/{1.73_m2} — ABNORMAL LOW (ref >59)

## 2022-08-03 ENCOUNTER — Telehealth (HOSPITAL_BASED_OUTPATIENT_CLINIC_OR_DEPARTMENT_OTHER): Payer: Self-pay

## 2022-08-03 DIAGNOSIS — E7800 Pure hypercholesterolemia, unspecified: Secondary | ICD-10-CM

## 2022-08-03 MED ORDER — ATORVASTATIN CALCIUM 80 MG PO TABS: 80.0000 mg | ORAL_TABLET | Freq: Every day | ORAL | 3 refills | Status: DC

## 2022-08-03 NOTE — Telephone Encounter (Addendum)
Seen by patient Joanna Powell on 08/01/2022 12:50 PM, Rx to pharmacy and labs mailed to patient with follow up mychart message to patient.    ----- Message from Loel Dubonnet, NP sent at 07/29/2022  5:25 PM EST ----- Stable kidney function. Normal liver and electrolytes. LDL not at goal of <70. Recommend increase Atorvastatin to '80mg'$  daily with repeat FLP/LFT in 2-3 months.

## 2022-10-13 ENCOUNTER — Other Ambulatory Visit: Payer: Self-pay | Admitting: Nurse Practitioner

## 2022-10-13 DIAGNOSIS — E559 Vitamin D deficiency, unspecified: Secondary | ICD-10-CM

## 2022-10-20 ENCOUNTER — Encounter: Payer: BC Managed Care – PPO | Admitting: Nurse Practitioner

## 2022-12-08 ENCOUNTER — Other Ambulatory Visit: Payer: Self-pay | Admitting: Obstetrics and Gynecology

## 2022-12-08 DIAGNOSIS — Z803 Family history of malignant neoplasm of breast: Secondary | ICD-10-CM

## 2022-12-08 DIAGNOSIS — R9230 Dense breasts, unspecified: Secondary | ICD-10-CM

## 2023-01-01 ENCOUNTER — Encounter: Payer: Self-pay | Admitting: Nurse Practitioner

## 2023-01-01 ENCOUNTER — Ambulatory Visit (INDEPENDENT_AMBULATORY_CARE_PROVIDER_SITE_OTHER): Payer: BC Managed Care – PPO | Admitting: Nurse Practitioner

## 2023-01-01 VITALS — BP 136/80 | HR 65 | Temp 98.2°F | Ht 62.0 in | Wt 185.8 lb

## 2023-01-01 DIAGNOSIS — I10 Essential (primary) hypertension: Secondary | ICD-10-CM

## 2023-01-01 DIAGNOSIS — N183 Chronic kidney disease, stage 3 unspecified: Secondary | ICD-10-CM | POA: Diagnosis not present

## 2023-01-01 DIAGNOSIS — Z0000 Encounter for general adult medical examination without abnormal findings: Secondary | ICD-10-CM | POA: Diagnosis not present

## 2023-01-01 DIAGNOSIS — I700 Atherosclerosis of aorta: Secondary | ICD-10-CM | POA: Diagnosis not present

## 2023-01-01 DIAGNOSIS — I129 Hypertensive chronic kidney disease with stage 1 through stage 4 chronic kidney disease, or unspecified chronic kidney disease: Secondary | ICD-10-CM

## 2023-01-01 DIAGNOSIS — M25471 Effusion, right ankle: Secondary | ICD-10-CM

## 2023-01-01 DIAGNOSIS — E6609 Other obesity due to excess calories: Secondary | ICD-10-CM

## 2023-01-01 DIAGNOSIS — R7303 Prediabetes: Secondary | ICD-10-CM

## 2023-01-01 DIAGNOSIS — Z6833 Body mass index (BMI) 33.0-33.9, adult: Secondary | ICD-10-CM

## 2023-01-01 DIAGNOSIS — E7800 Pure hypercholesterolemia, unspecified: Secondary | ICD-10-CM

## 2023-01-01 DIAGNOSIS — M25561 Pain in right knee: Secondary | ICD-10-CM

## 2023-01-01 DIAGNOSIS — G8929 Other chronic pain: Secondary | ICD-10-CM

## 2023-01-01 DIAGNOSIS — M25562 Pain in left knee: Secondary | ICD-10-CM

## 2023-01-01 DIAGNOSIS — M25472 Effusion, left ankle: Secondary | ICD-10-CM

## 2023-01-01 DIAGNOSIS — Z2821 Immunization not carried out because of patient refusal: Secondary | ICD-10-CM

## 2023-01-01 DIAGNOSIS — E559 Vitamin D deficiency, unspecified: Secondary | ICD-10-CM

## 2023-01-01 DIAGNOSIS — Z79899 Other long term (current) drug therapy: Secondary | ICD-10-CM

## 2023-01-01 NOTE — Assessment & Plan Note (Signed)
Sinus Bradycardia  WITHIN NORMAL LIMITS  HR 53  Blood pressure slightly elevated encouraged to focus on lifestyle modifications continue current medications.

## 2023-01-01 NOTE — Progress Notes (Unsigned)
Madelaine Bhat, CMA,acting as a Neurosurgeon for Arnette Felts, FNP.,have documented all relevant documentation on the behalf of Arnette Felts, FNP,as directed by  Arnette Felts, FNP while in the presence of Arnette Felts, FNP.  Subjective:    Patient ID: Joanna Powell , female    DOB: Oct 22, 1955 , 67 y.o.   MRN: [id number]  Chief Complaint  Patient presents with   Annual Exam    HPI  Patient presents today for a HM, BP and Chol check. Patient reports compliance with medications. Patient denies any chest pain, SOB, or headache. Patient reports her ankles have been swollen for a while. She is followed by Dr. Cherly Hensen for her GYN needs.   BP Readings from Last 3 Encounters: 01/01/23 : 136/80 06/24/22 : 128/72 04/10/22 : 128/76  Wt Readings from Last 3 Encounters: 01/01/23 : 185 lb 12.8 oz (84.3 kg) 06/24/22 : 177 lb (80.3 kg) 04/10/22 : 177 lb (80.3 kg)         Past Medical History:  Diagnosis Date   Hypertension    Pure hypercholesterolemia 01/24/2020     Family History  Problem Relation Age of Onset   Breast cancer Sister    Cancer Mother    Hypertension Mother    CAD Mother    Hypertension Father    Heart disease Father    CAD Brother      Current Outpatient Medications:    amLODipine-olmesartan (AZOR) 5-20 MG tablet, Take 1 tablet by mouth daily., Disp: 90 tablet, Rfl: 3   atorvastatin (LIPITOR) 80 MG tablet, Take 1 tablet (80 mg total) by mouth daily., Disp: 90 tablet, Rfl: 3   Vitamin D, Ergocalciferol, (DRISDOL) 1.25 MG (50000 UNIT) CAPS capsule, TAKE 1 CAPSULE (50,000 UNITS TOTAL) BY MOUTH TWO TIMES A WEEK, Disp: 12 capsule, Rfl: 1   No Known Allergies    The patient states she uses post menopausal status for birth control. No LMP recorded. Patient is postmenopausal.. Negative for Dysmenorrhea and Negative for Menorrhagia. Negative for: breast discharge, breast lump(s), breast pain and breast self exam. Associated symptoms include abnormal vaginal bleeding. Pertinent  negatives include abnormal bleeding (hematology), anxiety, decreased libido, depression, difficulty falling sleep, dyspareunia, history of infertility, nocturia, sexual dysfunction, sleep disturbances, urinary incontinence, urinary urgency, vaginal discharge and vaginal itching. Diet regular; tries to stick with eating vegetables and fruits. The patient states her exercise level is moderate 3-4 times a week 30 minute circuit.  The patient's tobacco use is:  Social History   Tobacco Use  Smoking Status Former  Smokeless Tobacco Never   She has been exposed to passive smoke. The patient's alcohol use is:  Social History   Substance and Sexual Activity  Alcohol Use Yes   Comment: occasional    Review of Systems  Constitutional: Negative.   HENT: Negative.    Eyes: Negative.   Respiratory: Negative.  Negative for shortness of breath.   Cardiovascular: Negative.  Negative for chest pain and palpitations.  Gastrointestinal: Negative.   Endocrine: Negative.   Genitourinary: Negative.   Musculoskeletal: Negative.   Skin: Negative.   Allergic/Immunologic: Negative.   Neurological: Negative.  Negative for headaches.  Hematological: Negative.   Psychiatric/Behavioral: Negative.       Today's Vitals   01/01/23 0954  BP: 136/80  Pulse: 65  Temp: 98.2 F (36.8 C)  TempSrc: Oral  Weight: 185 lb 12.8 oz (84.3 kg)  Height: 5\' 2"  (1.575 m)  PainSc: 0-No pain   Body mass index is 33.98 kg/m.  Wt Readings from Last 3 Encounters:  01/01/23 185 lb 12.8 oz (84.3 kg)  06/24/22 177 lb (80.3 kg)  04/10/22 177 lb (80.3 kg)     Objective:  Physical Exam Vitals reviewed.  Constitutional:      General: She is not in acute distress.    Appearance: Normal appearance. She is well-developed. She is obese.  HENT:     Head: Normocephalic and atraumatic.     Right Ear: Hearing, tympanic membrane, ear canal and external ear normal.     Left Ear: Hearing, tympanic membrane, ear canal and  external ear normal.     Nose: Nose normal.     Mouth/Throat:     Mouth: Mucous membranes are moist.  Eyes:     General: Lids are normal.        Right eye: No discharge.        Left eye: No discharge.     Extraocular Movements: Extraocular movements intact.     Pupils: Pupils are equal, round, and reactive to light.     Funduscopic exam:    Right eye: No papilledema.        Left eye: No papilledema.  Neck:     Thyroid: No thyroid mass.     Vascular: No carotid bruit.  Cardiovascular:     Rate and Rhythm: Normal rate and regular rhythm.     Pulses: Normal pulses.     Heart sounds: Normal heart sounds. No murmur heard. Pulmonary:     Effort: Pulmonary effort is normal. No respiratory distress.     Breath sounds: Normal breath sounds. No wheezing.  Abdominal:     General: Abdomen is flat. Bowel sounds are normal. There is no distension.     Palpations: Abdomen is soft.     Tenderness: There is no abdominal tenderness.  Genitourinary:    Comments: Deferred - has GYN Musculoskeletal:        General: No swelling or tenderness. Normal range of motion.     Cervical back: Full passive range of motion without pain, normal range of motion and neck supple.     Right lower leg: No edema.     Left lower leg: No edema.  Skin:    General: Skin is warm and dry.     Capillary Refill: Capillary refill takes less than 2 seconds.  Neurological:     General: No focal deficit present.     Mental Status: She is alert and oriented to person, place, and time.     Cranial Nerves: No cranial nerve deficit.     Sensory: No sensory deficit.  Psychiatric:        Mood and Affect: Mood normal.        Behavior: Behavior normal.        Thought Content: Thought content normal.        Judgment: Judgment normal.         Assessment And Plan:     Encounter for annual health examination  Benign hypertension with chronic kidney disease, stage III (HCC) -     EKG 12-Lead -     POCT URINALYSIS DIP  (CLINITEK) -     Microalbumin / creatinine urine ratio  Pure hypercholesterolemia -     CMP14+EGFR -     Lipid panel  Prediabetes -     CMP14+EGFR -     Hemoglobin A1c  Vitamin D deficiency -     VITAMIN D 25 Hydroxy (Vit-D Deficiency, Fractures)  Herpes zoster  vaccination declined  Class 1 obesity due to excess calories with serious comorbidity and body mass index (BMI) of 33.0 to 33.9 in adult  Other long term (current) drug therapy -     CBC with Differential/Platelet  Atherosclerosis of aorta (HCC)     Return for 1 year physical, 6 month bp check. Patient was given opportunity to ask questions. Patient verbalized understanding of the plan and was able to repeat key elements of the plan. All questions were answered to their satisfaction.   Arnette Felts, FNP  I, Arnette Felts, FNP, have reviewed all documentation for this visit. The documentation on 01/01/23 for the exam, diagnosis, procedures, and orders are all accurate and complete.

## 2023-01-06 ENCOUNTER — Other Ambulatory Visit: Payer: BC Managed Care – PPO

## 2023-01-06 DIAGNOSIS — M25472 Effusion, left ankle: Secondary | ICD-10-CM | POA: Insufficient documentation

## 2023-01-06 DIAGNOSIS — G8929 Other chronic pain: Secondary | ICD-10-CM | POA: Insufficient documentation

## 2023-01-06 DIAGNOSIS — E6609 Other obesity due to excess calories: Secondary | ICD-10-CM | POA: Insufficient documentation

## 2023-01-06 DIAGNOSIS — Z0000 Encounter for general adult medical examination without abnormal findings: Secondary | ICD-10-CM | POA: Insufficient documentation

## 2023-01-06 DIAGNOSIS — Z2821 Immunization not carried out because of patient refusal: Secondary | ICD-10-CM | POA: Insufficient documentation

## 2023-01-06 NOTE — Assessment & Plan Note (Signed)
Pain to her knees have been worsening will refer to orthopedics for further evaluation

## 2023-01-06 NOTE — Assessment & Plan Note (Signed)
Cholesterol levels are stable, continue statin, tolerating well.  

## 2023-01-06 NOTE — Assessment & Plan Note (Signed)
Behavior modifications discussed and diet history reviewed.   Pt will continue to exercise regularly and modify diet with low GI, plant based foods and decrease intake of processed foods.  Recommend intake of daily multivitamin, Vitamin D, and calcium.  Recommend mammogram and colonoscopy for preventive screenings, as well as recommend immunizations that include influenza, TDAP, and Shingles  

## 2023-01-06 NOTE — Assessment & Plan Note (Signed)
Sinus Bradycardia  WITHIN NORMAL LIMITS  HR 53  Blood pressure slightly elevated encouraged to focus on lifestyle modifications continue current medications.

## 2023-01-06 NOTE — Assessment & Plan Note (Signed)
Trace edema noted advised to keep feet elevated when possible and wear compression socks.  May be increased due to weight gain.

## 2023-01-06 NOTE — Assessment & Plan Note (Signed)
Hemoglobin A1c stable.  Will check levels today.  Diet controlled.

## 2023-01-06 NOTE — Assessment & Plan Note (Signed)
Continue statin. 

## 2023-01-06 NOTE — Assessment & Plan Note (Signed)
Declines shingrix, educated on disease process and is aware if he changes his mind to notify office  

## 2023-01-06 NOTE — Assessment & Plan Note (Signed)
Will check vitamin D level and supplement as needed.    Also encouraged to spend 15 minutes in the sun daily.   

## 2023-01-17 ENCOUNTER — Other Ambulatory Visit (HOSPITAL_BASED_OUTPATIENT_CLINIC_OR_DEPARTMENT_OTHER): Payer: Self-pay | Admitting: Cardiovascular Disease

## 2023-01-18 ENCOUNTER — Ambulatory Visit: Payer: BC Managed Care – PPO | Admitting: Orthopedic Surgery

## 2023-01-18 ENCOUNTER — Other Ambulatory Visit (INDEPENDENT_AMBULATORY_CARE_PROVIDER_SITE_OTHER): Payer: BC Managed Care – PPO

## 2023-01-18 DIAGNOSIS — M25562 Pain in left knee: Secondary | ICD-10-CM

## 2023-01-18 DIAGNOSIS — M25561 Pain in right knee: Secondary | ICD-10-CM | POA: Diagnosis not present

## 2023-01-18 DIAGNOSIS — M170 Bilateral primary osteoarthritis of knee: Secondary | ICD-10-CM | POA: Diagnosis not present

## 2023-01-18 DIAGNOSIS — G8929 Other chronic pain: Secondary | ICD-10-CM

## 2023-01-18 DIAGNOSIS — I878 Other specified disorders of veins: Secondary | ICD-10-CM

## 2023-01-19 ENCOUNTER — Encounter: Payer: Self-pay | Admitting: Orthopedic Surgery

## 2023-01-19 NOTE — Progress Notes (Signed)
Office Visit Note   Patient: Joanna Powell           Date of Birth: 02-17-1956           MRN: [id number] Visit Date: 01/18/2023              Requested by: Arnette Felts, FNP [address] [place],  [place] [address] PCP: Arnette Felts, FNP  Chief Complaint  Patient presents with   Right Knee - Pain   Left Knee - Pain   Right Ankle - Edema   Left Ankle - Edema      HPI: Patient is a 67 year old woman who is seen for initial evaluation for bilateral knee pain and swelling in both ankles.  Assessment & Plan: Visit Diagnoses:  1. Chronic pain of both knees   2. Bilateral primary osteoarthritis of knee     Plan: Recommended knee-high compression socks for the venous insufficiency both lower extremities.  Patient has asymptomatic bunions and will follow this conservatively.  Discussed treatment for the osteoarthritis of both knees including steroid injections versus hyaluronic acid injections versus total knee arthroplasty.  Patient states that she will continue with her strengthening exercises and she will try CBD lotion versus Voltaren gel.  Also recommended omega-3 fish oil.  Follow-Up Instructions: Return if symptoms worsen or fail to improve.   Ortho Exam  Patient is alert, oriented, no adenopathy, well-dressed, normal affect, normal respiratory effort. Examination patient has crepitation range of motion of both knees she has varus alignment worse on the left than the right knee.  There is minimal effusion in both knees with mild tenderness to palpation of the medial joint line bilaterally.  Patient has a good dorsalis pedis pulse bilaterally there is venous stasis insufficiency of both legs with swelling but no ulcers no dermatitis.  Patient has bunion deformity bilaterally without ulcers.  Imaging: XR Knee 1-2 Views Left  Result Date: 01/19/2023 2 view radiographs of the left knee shows subchondral sclerosis and cysts with narrowing of the medial joint space with  essentially bone-on-bone contact medially.  There are large osteophytic bone spurs in the patellofemoral joint.  XR Knee 1-2 Views Right  Result Date: 01/19/2023 2 view radiographs of the right knee shows subcondylar sclerosis and subcondylar cysts.  There are osteophytic bone spurs in the patellofemoral joint.  With medial joint space narrowing.  No images are attached to the encounter.  Labs: Lab Results  Component Value Date   HGBA1C 6.3 (H) 01/06/2023   HGBA1C 6.1 (H) 06/24/2022   HGBA1C 5.9 (H) 02/19/2022     Lab Results  Component Value Date   ALBUMIN 4.5 01/06/2023   ALBUMIN 4.6 07/28/2022   ALBUMIN 4.6 10/09/2021    No results found for: "MG" Lab Results  Component Value Date   VD25OH 66.5 01/06/2023   VD25OH 130.0 (H) 06/24/2022   VD25OH 25.7 (L) 10/09/2021    No results found for: "PREALBUMIN"    Latest Ref Rng & Units 01/06/2023    9:05 AM 10/09/2021    9:49 AM 10/03/2020   10:19 AM  CBC EXTENDED  WBC 3.4 - 10.8 x10E3/uL 4.8  4.7  5.0   RBC 3.77 - 5.28 x10E6/uL 4.87  4.85  4.90   Hemoglobin 11.1 - 15.9 g/dL 21.3  08.6  57.8   HCT 34.0 - 46.6 % 39.4  39.7  40.0   Platelets 150 - 450 x10E3/uL 428  413  426   NEUT# 1.4 - 7.0 x10E3/uL 2.5  Lymph# 0.7 - 3.1 x10E3/uL 1.6        There is no height or weight on file to calculate BMI.  Orders:  Orders Placed This Encounter  Procedures   XR Knee 1-2 Views Left   XR Knee 1-2 Views Right   No orders of the defined types were placed in this encounter.    Procedures: No procedures performed  Clinical Data: No additional findings.  ROS:  All other systems negative, except as noted in the HPI. Review of Systems  Objective: Vital Signs: There were no vitals taken for this visit.  Specialty Comments:  No specialty comments available.  PMFS History: Patient Active Problem List   Diagnosis Date Noted   Encounter for annual health examination 01/06/2023   Herpes zoster vaccination declined  01/06/2023   Chronic pain of both knees 01/06/2023   Ankle edema, bilateral 01/06/2023   Class 1 obesity due to excess calories with serious comorbidity and body mass index (BMI) of 33.0 to 33.9 in adult 01/06/2023   Atherosclerosis of aorta (HCC) 02/19/2022   Benign hypertension with chronic kidney disease, stage III (HCC) 02/19/2022   Class 1 obesity due to excess calories with serious comorbidity and body mass index (BMI) of 32.0 to 32.9 in adult 02/19/2022   Vitamin D deficiency 02/19/2022   Pure hypercholesterolemia 01/24/2020   Viral conjunctivitis of left eye 08/30/2018   Prediabetes 08/30/2018   Past Medical History:  Diagnosis Date   Hypertension    Pure hypercholesterolemia 01/24/2020    Family History  Problem Relation Age of Onset   Breast cancer Sister    Cancer Mother    Hypertension Mother    CAD Mother    Hypertension Father    Heart disease Father    CAD Brother     History reviewed. No pertinent surgical history. Social History   Occupational History   Not on file  Tobacco Use   Smoking status: Former   Smokeless tobacco: Never  Substance and Sexual Activity   Alcohol use: Yes    Comment: occasional   Drug use: Never   Sexual activity: Yes

## 2023-02-19 ENCOUNTER — Encounter: Payer: Self-pay | Admitting: Orthopedic Surgery

## 2023-02-27 ENCOUNTER — Other Ambulatory Visit: Payer: Self-pay | Admitting: Nurse Practitioner

## 2023-02-27 DIAGNOSIS — E559 Vitamin D deficiency, unspecified: Secondary | ICD-10-CM

## 2023-04-12 ENCOUNTER — Other Ambulatory Visit (HOSPITAL_BASED_OUTPATIENT_CLINIC_OR_DEPARTMENT_OTHER): Payer: Self-pay | Admitting: Cardiovascular Disease

## 2023-04-21 LAB — LAB REPORT - SCANNED
Creatinine, POC: 197.9 mg/dL
EGFR: 40

## 2023-04-28 ENCOUNTER — Encounter: Payer: Self-pay | Admitting: Nephrology

## 2023-05-01 ENCOUNTER — Ambulatory Visit (HOSPITAL_COMMUNITY)
Admission: EM | Admit: 2023-05-01 | Discharge: 2023-05-01 | Disposition: A | Discharge status: Home / Self Care | Payer: BC Managed Care – PPO | Attending: Family Medicine | Admitting: Family Medicine

## 2023-05-01 ENCOUNTER — Encounter (HOSPITAL_COMMUNITY): Payer: Self-pay | Admitting: Emergency Medicine

## 2023-05-01 DIAGNOSIS — R600 Localized edema: Secondary | ICD-10-CM | POA: Insufficient documentation

## 2023-05-01 LAB — CBC
HCT: 38.6 % (ref 36.0–46.0)
Hemoglobin: 12.4 g/dL (ref 12.0–15.0)
MCH: 26.9 pg (ref 26.0–34.0)
MCHC: 32.1 g/dL (ref 30.0–36.0)
MCV: 83.7 fL (ref 80.0–100.0)
Platelets: 430 10*3/uL — ABNORMAL HIGH (ref 150–400)
RBC: 4.61 MIL/uL (ref 3.87–5.11)
RDW: 14.7 % (ref 11.5–15.5)
WBC: 7.2 10*3/uL (ref 4.0–10.5)
nRBC: 0 % (ref 0.0–0.2)

## 2023-05-01 LAB — COMPREHENSIVE METABOLIC PANEL
ALT: 19 U/L (ref 0–44)
AST: 22 U/L (ref 15–41)
Albumin: 3.9 g/dL (ref 3.5–5.0)
Alkaline Phosphatase: 80 U/L (ref 38–126)
Anion gap: 8 (ref 5–15)
BUN: 33 mg/dL — ABNORMAL HIGH (ref 8–23)
CO2: 26 mmol/L (ref 22–32)
Calcium: 9.1 mg/dL (ref 8.9–10.3)
Chloride: 105 mmol/L (ref 98–111)
Creatinine, Ser: 1.9 mg/dL — ABNORMAL HIGH (ref 0.44–1.00)
GFR, Estimated: 29 mL/min — ABNORMAL LOW (ref >60)
Glucose, Bld: 96 mg/dL (ref 70–99)
Potassium: 4.5 mmol/L (ref 3.5–5.1)
Sodium: 139 mmol/L (ref 135–145)
Total Bilirubin: 0.9 mg/dL (ref <1.2)
Total Protein: 6.8 g/dL (ref 6.5–8.1)

## 2023-05-01 MED ORDER — FUROSEMIDE 20 MG PO TABS: ORAL_TABLET | ORAL | 0 refills | Status: DC

## 2023-05-01 NOTE — Discharge Instructions (Addendum)
The medicine amlodipine that you take for blood pressure can also cause swelling of the legs. You may call your primary provider to see if they want to change this.  -----  You have had labs (blood work) sent today. We will call you with any significant abnormalities or if there is need to begin or change treatment or pursue further follow up.  You may also review your test results online through MyChart. If you do not have a MyChart account, instructions to sign up should be on your discharge paperwork.

## 2023-05-01 NOTE — ED Triage Notes (Signed)
Pt c/o left foot swelling for 1 week. States she thinks she was bit by a bug. Denies any pain

## 2023-05-03 NOTE — ED Provider Notes (Signed)
[facility]   [id number] 05/01/23 Arrival Time: 1557  ASSESSMENT & PLAN:  1. Edema of left lower extremity    Unclear etiology re: non-traumatic foot/ankle swelling. She is on amlodipine; question relation. She will discuss this with her PCP. Agrees to schedule follow up.  Pending:     CBC   Comprehensive metabolic panel   Trial of: Meds ordered this encounter  Medications   furosemide (LASIX) 20 MG tablet    Sig: Take 1-2 tablets in the morning as needed for leg swelling.    Dispense:  20 tablet    Refill:  0   Reviewed expectations re: course of current medical issues. Questions answered. Outlined signs and symptoms indicating need for more acute intervention. Patient verbalized understanding. After Visit Summary given.  SUBJECTIVE: History from: patient. Joanna Powell is a 67 y.o. female who reports that her LEFT foot/ankle has been swollen for approx 1 week; gradual onset. Denies trauma. Questions if she suffered an insect bite/sting just prior to foot swelling; noted a small slightly itchy red area above left ankle prior to swelling; this has resolved. Foot is occasionally sore but does not limit daily activities. No extremity sensation changes or weakness. No new medications prior to current complaint. Denies CP/SOB.  History reviewed. No pertinent surgical history.    OBJECTIVE:  Vitals:   05/01/23 1638  BP: (!) 159/80  Pulse: 63  Resp: 17  Temp: 98 F (36.7 C)  TempSrc: Oral  SpO2: 96%    General appearance: alert; no distress HEENT: [place]; AT Neck: supple with FROM Resp: unlabored respirations Extremities: LLE: warm with well perfused appearance; moderate swelling of foot/ankle; 1+ pitting edema; without significant tenderness; ankle with FROM CV: brisk extremity capillary refill of LLE; 2+ DP pulse of LLE. Skin: warm and dry; no visible rashes Neurologic: gait normal; normal sensation and strength of LLE Psychological: alert and  cooperative; normal mood and affect   No Known Allergies  Past Medical History:  Diagnosis Date   Hypertension    Pure hypercholesterolemia 01/24/2020   Social History   Socioeconomic History   Marital status: Single    Spouse name: Not on file   Number of children: Not on file   Years of education: Not on file   Highest education level: Not on file  Occupational History   Not on file  Tobacco Use   Smoking status: Former   Smokeless tobacco: Never  Substance and Sexual Activity   Alcohol use: Yes    Comment: occasional   Drug use: Never   Sexual activity: Yes  Other Topics Concern   Not on file  Social History Narrative   Not on file   Social Determinants of Health   Financial Resource Strain: Not on file  Food Insecurity: Not on file  Transportation Needs: Not on file  Physical Activity: Not on file  Stress: Not on file  Social Connections: Not on file   Family History  Problem Relation Age of Onset   Breast cancer Sister    Cancer Mother    Hypertension Mother    CAD Mother    Hypertension Father    Heart disease Father    CAD Brother    History reviewed. No pertinent surgical history.     Mardella Layman, MD 05/03/23 1225

## 2023-05-10 ENCOUNTER — Ambulatory Visit: Payer: BC Managed Care – PPO | Admitting: Nurse Practitioner

## 2023-05-17 ENCOUNTER — Encounter: Payer: Self-pay | Admitting: Nurse Practitioner

## 2023-05-17 ENCOUNTER — Ambulatory Visit: Payer: BC Managed Care – PPO | Admitting: Nurse Practitioner

## 2023-05-17 VITALS — BP 120/70 | HR 65 | Temp 98.5°F | Ht 62.0 in | Wt 185.4 lb

## 2023-05-17 DIAGNOSIS — Z6833 Body mass index (BMI) 33.0-33.9, adult: Secondary | ICD-10-CM

## 2023-05-17 DIAGNOSIS — I700 Atherosclerosis of aorta: Secondary | ICD-10-CM | POA: Diagnosis not present

## 2023-05-17 DIAGNOSIS — Z2821 Immunization not carried out because of patient refusal: Secondary | ICD-10-CM | POA: Insufficient documentation

## 2023-05-17 DIAGNOSIS — I129 Hypertensive chronic kidney disease with stage 1 through stage 4 chronic kidney disease, or unspecified chronic kidney disease: Secondary | ICD-10-CM

## 2023-05-17 DIAGNOSIS — N183 Chronic kidney disease, stage 3 unspecified: Secondary | ICD-10-CM

## 2023-05-17 DIAGNOSIS — E66811 Obesity, class 1: Secondary | ICD-10-CM

## 2023-05-17 DIAGNOSIS — E6609 Other obesity due to excess calories: Secondary | ICD-10-CM

## 2023-05-17 DIAGNOSIS — M7989 Other specified soft tissue disorders: Secondary | ICD-10-CM

## 2023-05-17 NOTE — Assessment & Plan Note (Signed)
Continue statin. 

## 2023-05-17 NOTE — Assessment & Plan Note (Signed)

## 2023-05-17 NOTE — Assessment & Plan Note (Signed)
Blood pressure is well controlled, may need to change her amlodipine

## 2023-05-17 NOTE — Assessment & Plan Note (Signed)

## 2023-05-17 NOTE — Progress Notes (Signed)
Madelaine Bhat, CMA,acting as a Neurosurgeon for Arnette Felts, FNP.,have documented all relevant documentation on the behalf of Arnette Felts, FNP,as directed by  Arnette Felts, FNP while in the presence of Arnette Felts, FNP.  Subjective:  Patient ID: Joanna Powell , female    DOB: Apr 12, 1956 , 67 y.o.   MRN: [id number]  Chief Complaint  Patient presents with   Foot Swelling    HPI  Patient presents today for a swelling in left foot, Patient reports compliance with medication. Patient denies any chest pain, SOB, or headaches. Patient reports its been about 3 weeks since it first started, patient reports she did not hit her foot or anything. Patient reports it is still swollen. Patient did go to urgent care and was giving furosemide, she reports she did take it but has now stopped because it didn't help. She reports sometimes having sharp pain in foot. Reports the swelling is the same in the morning. She also reports it has discoloration.   She is taking amlodipine 5 mg daily.       Past Medical History:  Diagnosis Date   Hypertension    Pure hypercholesterolemia 01/24/2020     Family History  Problem Relation Age of Onset   Breast cancer Sister    Cancer Mother    Hypertension Mother    CAD Mother    Hypertension Father    Heart disease Father    CAD Brother      Current Outpatient Medications:    amLODipine-olmesartan (AZOR) 5-20 MG tablet, TAKE 1 TABLET BY MOUTH EVERY DAY, Disp: 90 tablet, Rfl: 3   atorvastatin (LIPITOR) 80 MG tablet, Take 1 tablet (80 mg total) by mouth daily., Disp: 90 tablet, Rfl: 3   Vitamin D, Ergocalciferol, (DRISDOL) 1.25 MG (50000 UNIT) CAPS capsule, TAKE 1 CAPSULE (50,000 UNITS) BY MOUTH TWO TIMES A WEEK, Disp: 12 capsule, Rfl: 1   furosemide (LASIX) 20 MG tablet, Take 1-2 tablets in the morning as needed for leg swelling. (Patient not taking: Reported on 05/17/2023), Disp: 20 tablet, Rfl: 0   No Known Allergies   Review of Systems  Constitutional:  Negative.   HENT: Negative.    Eyes: Negative.   Respiratory: Negative.    Cardiovascular: Negative.   Gastrointestinal: Negative.   Neurological: Negative.   Psychiatric/Behavioral: Negative.       Today's Vitals   05/17/23 1124  BP: 120/70  Pulse: 65  Temp: 98.5 F (36.9 C)  TempSrc: Oral  Weight: 185 lb 6.4 oz (84.1 kg)  Height: 5\' 2"  (1.575 m)  PainSc: 0-No pain   Body mass index is 33.91 kg/m.  Wt Readings from Last 3 Encounters:  05/17/23 185 lb 6.4 oz (84.1 kg)  01/01/23 185 lb 12.8 oz (84.3 kg)  06/24/22 177 lb (80.3 kg)     Objective:  Physical Exam Vitals reviewed.  Constitutional:      General: She is not in acute distress.    Appearance: Normal appearance. She is obese.  Cardiovascular:     Rate and Rhythm: Normal rate and regular rhythm.     Pulses: Normal pulses.     Heart sounds: Normal heart sounds. No murmur heard. Pulmonary:     Effort: Pulmonary effort is normal. No respiratory distress.     Breath sounds: Normal breath sounds. No wheezing.  Musculoskeletal:        General: Swelling (mild swelling to left lower extremity - left calf 15.5 ccm and left ankle 10.5 cm, right calf 15 cm  and right ankle 9.5 cm) present.     Right lower leg: No edema.     Left lower leg: No edema.     Comments: Slightly darker pigmentation to her left dorsal surface of foot.   Skin:    General: Skin is warm and dry.     Capillary Refill: Capillary refill takes less than 2 seconds.  Neurological:     General: No focal deficit present.     Mental Status: She is alert and oriented to person, place, and time.     Cranial Nerves: No cranial nerve deficit.     Motor: No weakness.  Psychiatric:        Mood and Affect: Mood normal.        Behavior: Behavior normal.        Thought Content: Thought content normal.        Judgment: Judgment normal.         Assessment And Plan:  Swelling of left foot -     VAS [place] LOWER EXTREMITY VENOUS (DVT); Future -     D-dimer,  quantitative  Atherosclerosis of aorta (HCC) Assessment & Plan: Continue statin   Benign hypertension with chronic kidney disease, stage III (HCC) Assessment & Plan: Blood pressure is well controlled, may need to change her amlodipine   Influenza vaccination declined Assessment & Plan: Patient declined influenza vaccination at this time. Patient is aware that influenza vaccine prevents illness in 70% of healthy people, and reduces hospitalizations to 30-70% in elderly. This vaccine is recommended annually. Education has been provided regarding the importance of this vaccine but patient still declined. Advised may receive this vaccine at local pharmacy or Health Dept.or vaccine clinic. Aware to provide a copy of the vaccination record if obtained from local pharmacy or Health Dept.  Pt is willing to accept risk associated with refusing vaccination.    COVID-19 vaccination declined Assessment & Plan: Declines covid 19 vaccine. Discussed risk of covid 90 and if she changes her mind about the vaccine to call the office. Education has been provided regarding the importance of this vaccine but patient still declined. Advised may receive this vaccine at local pharmacy or Health Dept.or vaccine clinic. Aware to provide a copy of the vaccination record if obtained from local pharmacy or Health Dept.  Encouraged to take multivitamin, vitamin d, vitamin c and zinc to increase immune system. Aware can call office if would like to have vaccine here at office. Verbalized acceptance and understanding.    Herpes zoster vaccination declined Assessment & Plan: Declines shingrix, educated on disease process and is aware if he changes his mind to notify office    Class 1 obesity due to excess calories with serious comorbidity and body mass index (BMI) of 33.0 to 33.9 in adult Assessment & Plan: She is encouraged to strive for BMI less than 30 to decrease cardiac risk. Advised to aim for at least 150  minutes of exercise per week.      Return for keep same next.  Patient was given opportunity to ask questions. Patient verbalized understanding of the plan and was able to repeat key elements of the plan. All questions were answered to their satisfaction.    Jeanell Sparrow, FNP, have reviewed all documentation for this visit. The documentation on 05/17/23 for the exam, diagnosis, procedures, and orders are all accurate and complete.   IF YOU HAVE BEEN REFERRED TO A SPECIALIST, IT MAY TAKE 1-2 WEEKS TO SCHEDULE/PROCESS THE REFERRAL. IF YOU  HAVE NOT HEARD FROM US/SPECIALIST IN TWO WEEKS, PLEASE GIVE [place] A CALL AT [phone].

## 2023-05-17 NOTE — Assessment & Plan Note (Signed)
Declines shingrix, educated on disease process and is aware if he changes his mind to notify office  

## 2023-05-17 NOTE — Patient Instructions (Signed)
If your ultrasound of your leg is negative for a blood clot and your d-dimer is normal you can go back to the gym

## 2023-05-17 NOTE — Assessment & Plan Note (Signed)
 She is encouraged to strive for BMI less than 30 to decrease cardiac risk. Advised to aim for at least 150 minutes of exercise per week.

## 2023-05-18 ENCOUNTER — Ambulatory Visit (HOSPITAL_COMMUNITY)
Admission: RE | Admit: 2023-05-18 | Discharge: 2023-05-18 | Disposition: A | Discharge status: Home / Self Care | Payer: BC Managed Care – PPO | Source: Ambulatory Visit | Attending: Nurse Practitioner | Admitting: Nurse Practitioner

## 2023-05-18 DIAGNOSIS — M7989 Other specified soft tissue disorders: Secondary | ICD-10-CM | POA: Insufficient documentation

## 2023-05-18 LAB — D-DIMER, QUANTITATIVE: D-DIMER: 0.58 mg{FEU}/L — ABNORMAL HIGH (ref 0.00–0.49)

## 2023-05-20 ENCOUNTER — Encounter: Payer: Self-pay | Admitting: Nurse Practitioner

## 2023-05-27 ENCOUNTER — Other Ambulatory Visit: Payer: Self-pay | Admitting: Nurse Practitioner

## 2023-05-27 DIAGNOSIS — M7122 Synovial cyst of popliteal space [Baker], left knee: Secondary | ICD-10-CM

## 2023-05-31 ENCOUNTER — Other Ambulatory Visit: Payer: Self-pay | Admitting: Nurse Practitioner

## 2023-05-31 ENCOUNTER — Other Ambulatory Visit: Payer: Self-pay | Admitting: Obstetrics and Gynecology

## 2023-05-31 DIAGNOSIS — E559 Vitamin D deficiency, unspecified: Secondary | ICD-10-CM

## 2023-05-31 DIAGNOSIS — Z1231 Encounter for screening mammogram for malignant neoplasm of breast: Secondary | ICD-10-CM

## 2023-06-24 LAB — HEPATIC FUNCTION PANEL
ALT: 27 [IU]/L (ref 0–32)
AST: 29 [IU]/L (ref 0–40)
Albumin: 4.5 g/dL (ref 3.9–4.9)
Alkaline Phosphatase: 102 [IU]/L (ref 44–121)
Bilirubin Total: 0.8 mg/dL (ref 0.0–1.2)
Bilirubin, Direct: 0.24 mg/dL (ref 0.00–0.40)
Total Protein: 7.2 g/dL (ref 6.0–8.5)

## 2023-06-24 LAB — LIPID PANEL
Chol/HDL Ratio: 2.7 {ratio} (ref 0.0–4.4)
Cholesterol, Total: 156 mg/dL (ref 100–199)
HDL: 57 mg/dL (ref >39)
LDL Chol Calc (NIH): 83 mg/dL (ref 0–99)
Triglycerides: 85 mg/dL (ref 0–149)
VLDL Cholesterol Cal: 16 mg/dL (ref 5–40)

## 2023-06-29 ENCOUNTER — Encounter (HOSPITAL_BASED_OUTPATIENT_CLINIC_OR_DEPARTMENT_OTHER): Payer: Self-pay | Admitting: *Deleted

## 2023-07-02 ENCOUNTER — Ambulatory Visit (HOSPITAL_BASED_OUTPATIENT_CLINIC_OR_DEPARTMENT_OTHER): Payer: BC Managed Care – PPO | Admitting: Family

## 2023-07-06 ENCOUNTER — Ambulatory Visit: Payer: 59 | Admitting: Nurse Practitioner

## 2023-07-06 ENCOUNTER — Other Ambulatory Visit: Payer: Self-pay | Admitting: *Deleted

## 2023-07-06 ENCOUNTER — Encounter: Payer: Self-pay | Admitting: Nurse Practitioner

## 2023-07-06 VITALS — BP 122/80 | HR 67 | Temp 98.6°F | Ht 62.0 in | Wt 190.8 lb

## 2023-07-06 DIAGNOSIS — Z6834 Body mass index (BMI) 34.0-34.9, adult: Secondary | ICD-10-CM

## 2023-07-06 DIAGNOSIS — E7800 Pure hypercholesterolemia, unspecified: Secondary | ICD-10-CM

## 2023-07-06 DIAGNOSIS — M7122 Synovial cyst of popliteal space [Baker], left knee: Secondary | ICD-10-CM

## 2023-07-06 DIAGNOSIS — E66811 Obesity, class 1: Secondary | ICD-10-CM

## 2023-07-06 DIAGNOSIS — N183 Chronic kidney disease, stage 3 unspecified: Secondary | ICD-10-CM

## 2023-07-06 DIAGNOSIS — I700 Atherosclerosis of aorta: Secondary | ICD-10-CM | POA: Diagnosis not present

## 2023-07-06 DIAGNOSIS — Z79899 Other long term (current) drug therapy: Secondary | ICD-10-CM

## 2023-07-06 DIAGNOSIS — I129 Hypertensive chronic kidney disease with stage 1 through stage 4 chronic kidney disease, or unspecified chronic kidney disease: Secondary | ICD-10-CM

## 2023-07-06 DIAGNOSIS — E6609 Other obesity due to excess calories: Secondary | ICD-10-CM

## 2023-07-06 DIAGNOSIS — Z2821 Immunization not carried out because of patient refusal: Secondary | ICD-10-CM

## 2023-07-06 LAB — CBC
Hematocrit: 40.8 % (ref 34.0–46.6)
Hemoglobin: 12.9 g/dL (ref 11.1–15.9)
MCH: 26.1 pg — ABNORMAL LOW (ref 26.6–33.0)
MCHC: 31.6 g/dL (ref 31.5–35.7)
MCV: 83 fL (ref 79–97)
Platelets: 434 10*3/uL (ref 150–450)
RBC: 4.94 x10E6/uL (ref 3.77–5.28)
RDW: 14.5 % (ref 11.7–15.4)
WBC: 5.3 10*3/uL (ref 3.4–10.8)

## 2023-07-06 NOTE — Progress Notes (Signed)
 I,Jameka J Llittleton, CMA,acting as a neurosurgeon for [employer], FNP.,have documented all relevant documentation on the behalf of [name], FNP,as directed by  [name], FNP while in the presence of [name], FNP.  Subjective:  Patient ID: Joanna Powell , female    DOB: 19-Nov-1955 , 68 y.o.   MRN: [id number]  Chief Complaint  Patient presents with   Hypertension    HPI  The patient is here today for a blood pressure f/u. Patient reports compliance with her meds. Patient denies having sob, headaches, or chest pain at this time. Patient would like to talk to you about a bakers cyst that she has. She is not interested in going to PT.  She will see Dr. Raford again in March. She had labs done which were normal.   She is established with Dr. Prescilla.   Hypertension This is a chronic problem. The current episode started more than 1 year ago. The problem is unchanged. The problem is controlled. There are no associated agents to hypertension. Risk factors for coronary artery disease include dyslipidemia, sedentary lifestyle and obesity. Past treatments include angiotensin blockers. Compliance problems include exercise.  There is no history of angina. There is no history of chronic renal disease.     Past Medical History:  Diagnosis Date   Hypertension    Pure hypercholesterolemia 01/24/2020     Family History  Problem Relation Age of Onset   Breast cancer Sister    Cancer Mother    Hypertension Mother    CAD Mother    Hypertension Father    Heart disease Father    CAD Brother      Current Outpatient Medications:    amLODipine -olmesartan  (AZOR ) 5-20 MG tablet, TAKE 1 TABLET BY MOUTH EVERY DAY, Disp: 90 tablet, Rfl: 3   atorvastatin  (LIPITOR) 80 MG tablet, Take 1 tablet (80 mg total) by mouth daily., Disp: 90 tablet, Rfl: 3   Vitamin D , Ergocalciferol , (DRISDOL ) 1.25 MG (50000 UNIT) CAPS capsule, TAKE 1 CAPSULE (50,000 UNITS) BY MOUTH TWO TIMES A WEEK, Disp: 12 capsule,  Rfl: 1   furosemide  (LASIX ) 20 MG tablet, Take 1-2 tablets in the morning as needed for leg swelling. (Patient not taking: Reported on 07/06/2023), Disp: 20 tablet, Rfl: 0   No Known Allergies   Review of Systems  Constitutional: Negative.   Eyes: Negative.   Respiratory: Negative.    Cardiovascular: Negative.   Musculoskeletal: Negative.   Skin: Negative.   Neurological: Negative.   Psychiatric/Behavioral: Negative.       Today's Vitals   07/06/23 0838  BP: 122/80  Pulse: 67  Temp: 98.6 F (37 C)  TempSrc: Oral  Weight: 190 lb 12.8 oz (86.5 kg)  Height: 5' 2 (1.575 m)  PainSc: 0-No pain   Body mass index is 34.9 kg/m.  Wt Readings from Last 3 Encounters:  07/06/23 190 lb 12.8 oz (86.5 kg)  05/17/23 185 lb 6.4 oz (84.1 kg)  01/01/23 185 lb 12.8 oz (84.3 kg)    Objective:  Physical Exam Vitals reviewed.  Constitutional:      General: She is not in acute distress.    Appearance: Normal appearance. She is obese.  Cardiovascular:     Rate and Rhythm: Normal rate and regular rhythm.     Pulses: Normal pulses.     Heart sounds: Normal heart sounds. No murmur heard. Pulmonary:     Effort: Pulmonary effort is normal. No respiratory distress.     Breath sounds: Normal breath sounds.  No wheezing.  Musculoskeletal:     Right lower leg: No edema.     Left lower leg: No edema.  Skin:    General: Skin is warm and dry.     Capillary Refill: Capillary refill takes less than 2 seconds.  Neurological:     General: No focal deficit present.     Mental Status: She is alert and oriented to person, place, and time.     Cranial Nerves: No cranial nerve deficit.     Motor: No weakness.  Psychiatric:        Mood and Affect: Mood normal.        Behavior: Behavior normal.        Thought Content: Thought content normal.        Judgment: Judgment normal.         Assessment And Plan:  Benign hypertension with chronic kidney disease, stage III (HCC) Assessment & Plan: Blood  pressure is well controlled, continue current medications   Atherosclerosis of aorta (HCC) Assessment & Plan: Continue statin, tolerating well   Pure hypercholesterolemia Assessment & Plan: Cholesterol levels are stable, continue statin, tolerating well.   Synovial cyst of left popliteal space Assessment & Plan: She is not interested in PT at this time. No current issues.  We did have a long conversation about bakers cyst but would like to continue to monitor.    Class 1 obesity due to excess calories without serious comorbidity with body mass index (BMI) of 34.0 to 34.9 in adult  COVID-19 vaccination declined Assessment & Plan: Declines covid 19 vaccine. Discussed risk of covid 40 and if she changes her mind about the vaccine to call the office. Education has been provided regarding the importance of this vaccine but patient still declined. Advised may receive this vaccine at local pharmacy or Health Dept.or vaccine clinic. Aware to provide a copy of the vaccination record if obtained from local pharmacy or Health Dept.  Encouraged to take multivitamin, vitamin d , vitamin c and zinc to increase immune system. Aware can call office if would like to have vaccine here at office. Verbalized acceptance and understanding.    Other long term (current) drug therapy -     CBC    Return in about 4 months (around 11/03/2023) for bpc.  Patient was given opportunity to ask questions. Patient verbalized understanding of the plan and was able to repeat key elements of the plan. All questions were answered to their satisfaction.    LILLETTE [name], FNP, have reviewed all documentation for this visit. The documentation on 07/06/23 for the exam, diagnosis, procedures, and orders are all accurate and complete.   IF YOU HAVE BEEN REFERRED TO A SPECIALIST, IT MAY TAKE 1-2 WEEKS TO SCHEDULE/PROCESS THE REFERRAL. IF YOU HAVE NOT HEARD FROM US /SPECIALIST IN TWO WEEKS, PLEASE GIVE US  A CALL AT  [phone].

## 2023-07-06 NOTE — Assessment & Plan Note (Signed)
 Blood pressure is well controlled, continue current medications.

## 2023-07-06 NOTE — Assessment & Plan Note (Signed)

## 2023-07-06 NOTE — Assessment & Plan Note (Signed)
Cholesterol levels are stable, continue statin, tolerating well.  

## 2023-07-06 NOTE — Assessment & Plan Note (Signed)
 Continue statin, tolerating well

## 2023-07-06 NOTE — Assessment & Plan Note (Addendum)
She is not interested in PT at this time. No current issues.  We did have a long conversation about bakers cyst but would like to continue to monitor.

## 2023-07-06 NOTE — Patient Instructions (Signed)
 Hypertension, Adult Hypertension is another name for high blood pressure. High blood pressure forces your heart to work harder to pump blood. This can cause problems over time. There are two numbers in a blood pressure reading. There is a top number (systolic) over a bottom number (diastolic). It is best to have a blood pressure that is below 120/80. What are the causes? The cause of this condition is not known. Some other conditions can lead to high blood pressure. What increases the risk? Some lifestyle factors can make you more likely to develop high blood pressure: Smoking. Not getting enough exercise or physical activity. Being overweight. Having too much fat, sugar, calories, or salt (sodium) in your diet. Drinking too much alcohol. Other risk factors include: Having any of these conditions: Heart disease. Diabetes. High cholesterol. Kidney disease. Obstructive sleep apnea. Having a family history of high blood pressure and high cholesterol. Age. The risk increases with age. Stress. What are the signs or symptoms? High blood pressure may not cause symptoms. Very high blood pressure (hypertensive crisis) may cause: Headache. Fast or uneven heartbeats (palpitations). Shortness of breath. Nosebleed. Vomiting or feeling like you may vomit (nauseous). Changes in how you see. Very bad chest pain. Feeling dizzy. Seizures. How is this treated? This condition is treated by making healthy lifestyle changes, such as: Eating healthy foods. Exercising more. Drinking less alcohol. Your doctor may prescribe medicine if lifestyle changes do not help enough and if: Your top number is above 130. Your bottom number is above 80. Your personal target blood pressure may vary. Follow these instructions at home: Eating and drinking  If told, follow the DASH eating plan. To follow this plan: Fill one half of your plate at each meal with fruits and vegetables. Fill one fourth of your plate  at each meal with whole grains. Whole grains include whole-wheat pasta, brown rice, and whole-grain bread. Eat or drink low-fat dairy products, such as skim milk or low-fat yogurt. Fill one fourth of your plate at each meal with low-fat (lean) proteins. Low-fat proteins include fish, chicken without skin, eggs, beans, and tofu. Avoid fatty meat, cured and processed meat, or chicken with skin. Avoid pre-made or processed food. Limit the amount of salt in your diet to less than 1,500 mg each day. Do not drink alcohol if: Your doctor tells you not to drink. You are pregnant, may be pregnant, or are planning to become pregnant. If you drink alcohol: Limit how much you have to: 0-1 drink a day for women. 0-2 drinks a day for men. Know how much alcohol is in your drink. In the [place] one drink equals one 12 oz bottle of beer (355 mL), one 5 oz glass of wine (148 mL), or one 1 oz glass of hard liquor (44 mL). Lifestyle  Work with your doctor to stay at a healthy weight or to lose weight. Ask your doctor what the best weight is for you. Get at least 30 minutes of exercise that causes your heart to beat faster (aerobic exercise) most days of the week. This may include walking, swimming, or biking. Get at least 30 minutes of exercise that strengthens your muscles (resistance exercise) at least 3 days a week. This may include lifting weights or doing Pilates. Do not smoke or use any products that contain nicotine or tobacco. If you need help quitting, ask your doctor. Check your blood pressure at home as told by your doctor. Keep all follow-up visits. Medicines Take over-the-counter and prescription medicines  only as told by your doctor. Follow directions carefully. Do not skip doses of blood pressure medicine. The medicine does not work as well if you skip doses. Skipping doses also puts you at risk for problems. Ask your doctor about side effects or reactions to medicines that you should watch  for. Contact a doctor if: You think you are having a reaction to the medicine you are taking. You have headaches that keep coming back. You feel dizzy. You have swelling in your ankles. You have trouble with your vision. Get help right away if: You get a very bad headache. You start to feel mixed up (confused). You feel weak or numb. You feel faint. You have very bad pain in your: Chest. Belly (abdomen). You vomit more than once. You have trouble breathing. These symptoms may be an emergency. Get help right away. Call 911. Do not wait to see if the symptoms will go away. Do not drive yourself to the hospital. Summary Hypertension is another name for high blood pressure. High blood pressure forces your heart to work harder to pump blood. For most people, a normal blood pressure is less than 120/80. Making healthy choices can help lower blood pressure. If your blood pressure does not get lower with healthy choices, you may need to take medicine. This information is not intended to replace advice given to you by your health care provider. Make sure you discuss any questions you have with your health care provider. Document Revised: 03/06/2021 Document Reviewed: 03/06/2021 Elsevier Patient Education  2024 [employer].

## 2023-07-07 ENCOUNTER — Encounter: Payer: Self-pay | Admitting: Nurse Practitioner

## 2023-07-15 ENCOUNTER — Ambulatory Visit
Admission: RE | Admit: 2023-07-15 | Discharge: 2023-07-15 | Disposition: A | Discharge status: Home / Self Care | Payer: 59 | Source: Ambulatory Visit | Attending: Obstetrics and Gynecology | Admitting: Obstetrics and Gynecology

## 2023-07-15 DIAGNOSIS — Z1231 Encounter for screening mammogram for malignant neoplasm of breast: Secondary | ICD-10-CM

## 2023-07-22 ENCOUNTER — Other Ambulatory Visit (HOSPITAL_BASED_OUTPATIENT_CLINIC_OR_DEPARTMENT_OTHER): Payer: Self-pay | Admitting: Cardiovascular Disease

## 2023-07-22 DIAGNOSIS — E7800 Pure hypercholesterolemia, unspecified: Secondary | ICD-10-CM

## 2023-08-12 ENCOUNTER — Ambulatory Visit (HOSPITAL_BASED_OUTPATIENT_CLINIC_OR_DEPARTMENT_OTHER): Payer: BC Managed Care – PPO | Admitting: Cardiovascular Disease

## 2023-08-12 ENCOUNTER — Encounter (HOSPITAL_BASED_OUTPATIENT_CLINIC_OR_DEPARTMENT_OTHER): Payer: Self-pay | Admitting: Cardiovascular Disease

## 2023-08-12 VITALS — BP 128/82 | HR 60 | Ht 62.0 in | Wt 188.8 lb

## 2023-08-12 DIAGNOSIS — N183 Chronic kidney disease, stage 3 unspecified: Secondary | ICD-10-CM

## 2023-08-12 DIAGNOSIS — I700 Atherosclerosis of aorta: Secondary | ICD-10-CM

## 2023-08-12 DIAGNOSIS — I129 Hypertensive chronic kidney disease with stage 1 through stage 4 chronic kidney disease, or unspecified chronic kidney disease: Secondary | ICD-10-CM | POA: Diagnosis not present

## 2023-08-12 DIAGNOSIS — E7800 Pure hypercholesterolemia, unspecified: Secondary | ICD-10-CM | POA: Diagnosis not present

## 2023-08-12 DIAGNOSIS — R7303 Prediabetes: Secondary | ICD-10-CM

## 2023-08-12 NOTE — Progress Notes (Signed)
 Cardiology Office Note:  .   Date:  08/12/2023  ID:  Joanna Powell, DOB Jan 15, 1956, MRN [id number] PCP: Arnette Felts, FNP  [facility] Providers Cardiologist:  None    History of Present Illness: .    Joanna Powell is a 69 y.o. female with nonobstructive CAD, CKD 3b, hypertension, hyperlipidemia and prior tobacco abuse here for follow up.  She was initially seen 12/2019.  At that time she wanted to work on diet and exercise.  She was also started on amlodipine.  The dose was subsequently increased with our pharmacist.  She was initially diagnosed with HTN may years ago.  She was initially on valsartan and switched to olmesartan due to the recall.    She followed up with her nephrologist and they reduced her amlodipine/olmesartan to 5/20 and her blood pressures remained controlled. She held her statin and her lipids became very elevated. She got a coronary calcium score 01/2021 with a score of 295 which was 94th percentile. We recommended that she resume and increase her statin.  At her visit 04/2022 she was feeling well and blood pressures were well-controlled.  She was struggling with her diet but doing a good job with exercise.  Ms. Voorheis has been experiencing fluctuating blood pressure readings over the past few days. On Monday, she felt significant pressure in her head and recorded a blood pressure of 195/80 mmHg at the health service, which later decreased to 151 mmHg. Previous readings included 171 mmHg and 146 mmHg, indicating variability. No recent changes in medication, stress, or diet have occurred. She maintains a healthy diet rich in fruits and vegetables, avoiding processed and fast foods.  She is currently on olmesartan and amlodipine for blood pressure management, and atorvastatin for cholesterol. In January, her LDL was still high, and she was advised to add Zetia and focus on diet and exercise. She confirms making dietary changes since then.  In November, her kidney function was  noted to be slightly worse than her baseline with a creatinine level of 1.9 mg/dL, compared to her usual 1.5 mg/dL. She reports staying well-hydrated and drinking lots of water.  She mentions a previous episode of swelling in her left foot, which was evaluated and found to be without blood clots. She was prescribed furosemide for the swelling but has since stopped taking it as the swelling resolved.      ROS:  As per HPI  Studies Reviewed: .         Risk Assessment/Calculations:             Physical Exam:   VS:  BP 128/82   Pulse 60   Ht 5\' 2"  (1.575 m)   Wt 188 lb 12.8 oz (85.6 kg)   BMI 34.53 kg/m  , BMI Body mass index is 34.53 kg/m. GENERAL:  Well appearing HEENT: Pupils equal round and reactive, fundi not visualized, oral mucosa unremarkable NECK:  No jugular venous distention, waveform within normal limits, carotid upstroke brisk and symmetric, no bruits, no thyromegaly LUNGS:  Clear to auscultation bilaterally HEART:  RRR.  PMI not displaced or sustained,S1 and S2 within normal limits, no S3, no S4, no clicks, no rubs, II/VI systolic murmur at the LUSB ABD:  Flat, positive bowel sounds normal in frequency in pitch, no bruits, no rebound, no guarding, no midline pulsatile mass, no hepatomegaly, no splenomegaly EXT:  2 plus pulses throughout, no edema, no cyanosis no clubbing SKIN:  No rashes no nodules NEURO:  Cranial nerves II through  XII grossly intact, motor grossly intact throughout PSYCH:  Cognitively intact, oriented to person place and time   ASSESSMENT AND PLAN: .    # Hypertension: Intermittent elevated blood pressure with recent significant elevations. Possible contributing factors include allergies and constipation. Current medications: olmesartan and amlodipine. Emphasized avoiding over-medication unless consistent hypertension is observed. - Monitor blood pressure twice daily for two weeks. - Submit blood pressure readings via MyChart. - Consider  medication adjustment based on blood pressure pattern.  # Hyperlipidemia: Elevated LDL cholesterol in January. Advised dietary and exercise changes. Considering Zetia. Current LDL status to be assessed with today's labs. - Check current cholesterol levels. - Adjust cholesterol management based on lab results.   # CKD 3b:  Previous creatinine level was 1.9, above baseline of 1.5. Kidney function was well-managed per nephrologist in November. Emphasized hydration to maintain kidney function. - Check kidney function with today's labs. - Ensure adequate hydration.  # Peripheral edema Previous left foot swelling resolved without furosemide. No current edema. Discontinuation of furosemide is appropriate to prevent kidney function impact. - Discontinue furosemide.  # Follow-up Ongoing management of hypertension and hyperlipidemia with nurse practitioners. Adjustments based on lab results and blood pressure readings if necessary. - Schedule follow-up with nurse practitioners Luther Parody or [place] in 2-3 months. - Adjust management plan based on lab results and blood pressure readings if necessary.      Dispo: f/u with APP in 2-3 months.  Follow up with me in 1 year.   Signed, Chilton Si, MD

## 2023-08-12 NOTE — Patient Instructions (Signed)
 Medication Instructions:  Your physician recommends that you continue on your current medications as directed. Please refer to the Current Medication list given to you today.  *If you need a refill on your cardiac medications before your next appointment, please call your pharmacy*   Lab Work: Your physician recommends that you return for lab work today- Lipid Panel and CMP    Follow-Up: At [facility], you and your health needs are our priority.  As part of our continuing mission to provide you with exceptional heart care, we have created designated Provider Care Teams.  These Care Teams include your primary Cardiologist (physician) and Advanced Practice Providers (APPs -  Physician Assistants and Nurse Practitioners) who all work together to provide you with the care you need, when you need it.  We recommend signing up for the patient portal called "MyChart".  Sign up information is provided on this After Visit Summary.  MyChart is used to connect with patients for Virtual Visits (Telemedicine).  Patients are able to view lab/test results, encounter notes, upcoming appointments, etc.  Non-urgent messages can be sent to your provider as well.   To learn more about what you can do with MyChart, go to [email or web address].    Your next appointment:   3 month(s)  Provider:   Gillian Shields, NP or [facility]     Other Instructions Please check your blood pressure twice per day and then send those readings in a mychart message in two weeks!

## 2023-08-13 LAB — COMPREHENSIVE METABOLIC PANEL
ALT: 24 IU/L (ref 0–32)
AST: 27 IU/L (ref 0–40)
Albumin: 4.6 g/dL (ref 3.9–4.9)
Alkaline Phosphatase: 102 IU/L (ref 44–121)
BUN/Creatinine Ratio: 11 — ABNORMAL LOW (ref 12–28)
BUN: 18 mg/dL (ref 8–27)
Bilirubin Total: 1 mg/dL (ref 0.0–1.2)
CO2: 24 mmol/L (ref 20–29)
Calcium: 9.7 mg/dL (ref 8.7–10.3)
Chloride: 100 mmol/L (ref 96–106)
Creatinine, Ser: 1.62 mg/dL — ABNORMAL HIGH (ref 0.57–1.00)
Globulin, Total: 2.8 g/dL (ref 1.5–4.5)
Glucose: 98 mg/dL (ref 70–99)
Potassium: 5.3 mmol/L — ABNORMAL HIGH (ref 3.5–5.2)
Sodium: 142 mmol/L (ref 134–144)
Total Protein: 7.4 g/dL (ref 6.0–8.5)
eGFR: 34 mL/min/{1.73_m2} — ABNORMAL LOW (ref >59)

## 2023-08-13 LAB — LIPID PANEL
Chol/HDL Ratio: 3 ratio (ref 0.0–4.4)
Cholesterol, Total: 152 mg/dL (ref 100–199)
HDL: 50 mg/dL (ref >39)
LDL Chol Calc (NIH): 85 mg/dL (ref 0–99)
Triglycerides: 91 mg/dL (ref 0–149)
VLDL Cholesterol Cal: 17 mg/dL (ref 5–40)

## 2023-08-16 ENCOUNTER — Encounter (HOSPITAL_BASED_OUTPATIENT_CLINIC_OR_DEPARTMENT_OTHER): Payer: Self-pay | Admitting: *Deleted

## 2023-08-17 ENCOUNTER — Other Ambulatory Visit (HOSPITAL_BASED_OUTPATIENT_CLINIC_OR_DEPARTMENT_OTHER): Payer: Self-pay

## 2023-08-17 DIAGNOSIS — E7800 Pure hypercholesterolemia, unspecified: Secondary | ICD-10-CM

## 2023-08-17 MED ORDER — ATORVASTATIN CALCIUM 80 MG PO TABS: 80.0000 mg | ORAL_TABLET | Freq: Every day | ORAL | 1 refills | Status: DC

## 2023-08-30 ENCOUNTER — Encounter (HOSPITAL_BASED_OUTPATIENT_CLINIC_OR_DEPARTMENT_OTHER): Payer: Self-pay | Admitting: Cardiovascular Disease

## 2023-09-01 ENCOUNTER — Encounter: Payer: Self-pay | Admitting: Nurse Practitioner

## 2023-09-02 ENCOUNTER — Ambulatory Visit: Admitting: Nurse Practitioner

## 2023-09-02 ENCOUNTER — Encounter: Payer: Self-pay | Admitting: Nurse Practitioner

## 2023-09-02 VITALS — BP 128/80 | HR 65 | Temp 98.7°F | Ht 62.0 in | Wt 193.4 lb

## 2023-09-02 DIAGNOSIS — R350 Frequency of micturition: Secondary | ICD-10-CM | POA: Diagnosis not present

## 2023-09-02 DIAGNOSIS — Z6835 Body mass index (BMI) 35.0-35.9, adult: Secondary | ICD-10-CM

## 2023-09-02 DIAGNOSIS — R300 Dysuria: Secondary | ICD-10-CM | POA: Diagnosis not present

## 2023-09-02 DIAGNOSIS — E66812 Obesity, class 2: Secondary | ICD-10-CM | POA: Diagnosis not present

## 2023-09-02 DIAGNOSIS — E6609 Other obesity due to excess calories: Secondary | ICD-10-CM

## 2023-09-02 DIAGNOSIS — Z2821 Immunization not carried out because of patient refusal: Secondary | ICD-10-CM

## 2023-09-02 LAB — POCT URINALYSIS DIP (CLINITEK)
Bilirubin, UA: NEGATIVE
Blood, UA: NEGATIVE
Glucose, UA: NEGATIVE mg/dL
Ketones, POC UA: NEGATIVE mg/dL
Nitrite, UA: NEGATIVE
POC PROTEIN,UA: NEGATIVE
Spec Grav, UA: 1.01 (ref 1.010–1.025)
Urobilinogen, UA: 0.2 U/dL
pH, UA: 6 (ref 5.0–8.0)

## 2023-09-02 NOTE — Patient Instructions (Signed)
 Continue to increase your water intake to 1L per day  You can take over the counter AZO cranberry pills if you continue to have burning with urination.  Ensure that you do not hold your urine for a long period of time.  Pee after sex, do not use any products in the genital area, wipe front to back after using the rest room.

## 2023-09-02 NOTE — Progress Notes (Signed)
 Del Favia, CMA,acting as a Neurosurgeon for Joanna Epley, FNP.,have documented all relevant documentation on the behalf of Joanna Epley, FNP,as directed by  Joanna Epley, FNP while in the presence of Joanna Epley, FNP.  Subjective:  Patient ID: Joanna Powell , female    DOB: May 22, 1956 , 68 y.o.   MRN: [id number]  Chief Complaint  Patient presents with   Dysuria    HPI  Patient presents today for possible UTI, patient reports having frequent urination and burning while urinating. That started yesterday she is no longer feeling these symptoms. Had held her urine yesterday and had burning and low abdominal pressure with urination.  No fever or chills, no nausea, vomiting or diarrhea, and no headaches or dizziness.  She increased her water intake yesterday and today.  She did not have burning with urination this morning, but wants to have her urine checked to ensure that she doesn't need to treat an infection.  She is estabished with [facility] for renal monitoring and has an appointment for follow up next month.    Urinary Tract Infection  This is a new problem. The current episode started yesterday. The problem occurs intermittently. The problem has been rapidly improving. The quality of the pain is described as burning. The pain is at a severity of 8/10. The pain is moderate. There has been no fever. She is Sexually active. There is A history of pyelonephritis (over 5 years). Associated symptoms include frequency and urgency. She has tried nothing for the symptoms. The treatment provided significant relief.     Past Medical History:  Diagnosis Date   Hypertension    Pure hypercholesterolemia 01/24/2020     Family History  Problem Relation Age of Onset   Breast cancer Sister    Cancer Mother    Hypertension Mother    CAD Mother    Hypertension Father    Heart disease Father    CAD Brother      Current Outpatient Medications:    amLODipine-olmesartan (AZOR) 5-20 MG  tablet, TAKE 1 TABLET BY MOUTH EVERY DAY, Disp: 90 tablet, Rfl: 3   atorvastatin (LIPITOR) 80 MG tablet, Take 1 tablet (80 mg total) by mouth daily., Disp: 90 tablet, Rfl: 1   furosemide (LASIX) 20 MG tablet, Take 1-2 tablets in the morning as needed for leg swelling., Disp: 20 tablet, Rfl: 0   Vitamin D, Ergocalciferol, (DRISDOL) 1.25 MG (50000 UNIT) CAPS capsule, TAKE 1 CAPSULE (50,000 UNITS) BY MOUTH TWO TIMES A WEEK, Disp: 12 capsule, Rfl: 1   No Known Allergies   Review of Systems  Constitutional: Negative.   HENT: Negative.    Eyes: Negative.   Respiratory: Negative.    Cardiovascular: Negative.   Gastrointestinal: Negative.   Endocrine: Negative.   Genitourinary:  Positive for frequency and urgency.  Musculoskeletal: Negative.   Skin: Negative.   Allergic/Immunologic: Negative.   Neurological: Negative.   Hematological: Negative.   Psychiatric/Behavioral: Negative.       Today's Vitals   09/02/23 1041  BP: 128/80  Pulse: 65  Temp: 98.7 F (37.1 C)  TempSrc: Oral  Weight: 193 lb 6.4 oz (87.7 kg)  Height: 5\' 2"  (1.575 m)  PainSc: 0-No pain   Body mass index is 35.37 kg/m.  Wt Readings from Last 3 Encounters:  09/02/23 193 lb 6.4 oz (87.7 kg)  08/12/23 188 lb 12.8 oz (85.6 kg)  07/06/23 190 lb 12.8 oz (86.5 kg)     Objective:  Physical Exam Constitutional:  Appearance: Normal appearance.  HENT:     Head: Normocephalic and atraumatic.     Right Ear: External ear normal.     Left Ear: External ear normal.     Nose: Nose normal.  Eyes:     Pupils: Pupils are equal, round, and reactive to light.  Cardiovascular:     Rate and Rhythm: Normal rate and regular rhythm.     Pulses: Normal pulses.     Heart sounds: Normal heart sounds.  Pulmonary:     Effort: Pulmonary effort is normal.     Breath sounds: Normal breath sounds.  Abdominal:     General: Abdomen is flat. Bowel sounds are normal.     Palpations: Abdomen is soft.     Comments: No CVA  Tenderness, no palpable kidneys, no tenderness across abdomen  Musculoskeletal:     Cervical back: Normal range of motion.  Skin:    General: Skin is warm and dry.  Neurological:     General: No focal deficit present.     Mental Status: She is alert and oriented to person, place, and time.  Psychiatric:        Mood and Affect: Mood normal.        Behavior: Behavior normal.        Thought Content: Thought content normal.        Assessment And Plan:  Dysuria Assessment & Plan: Urinalysis trace leukocytes, will send for urine culture. Encouraged to stay well hydrated with water and avoid dark sodas  Orders: -     POCT URINALYSIS DIP (CLINITEK) -     Urine Culture  Urinary frequency -     POCT URINALYSIS DIP (CLINITEK) -     Urine Culture  COVID-19 vaccination declined Assessment & Plan: Declines covid 19 vaccine. Discussed risk of covid 36 and if she changes her mind about the vaccine to call the office. Education has been provided regarding the importance of this vaccine but patient still declined. Advised may receive this vaccine at local pharmacy or Health Dept.or vaccine clinic. Aware to provide a copy of the vaccination record if obtained from local pharmacy or Health Dept.  Encouraged to take multivitamin, vitamin d, vitamin c and zinc to increase immune system. Aware can call office if would like to have vaccine here at office. Verbalized acceptance and understanding.    Herpes zoster vaccination declined Assessment & Plan: Declines shingrix, educated on disease process and is aware if he changes his mind to notify office    Class 2 obesity due to excess calories with body mass index (BMI) of 35.0 to 35.9 in adult, unspecified whether serious comorbidity present Assessment & Plan: She is encouraged to strive for BMI less than 30 to decrease cardiac risk. Advised to aim for at least 150 minutes of exercise per week.      Return for keep same next.  Patient was given  opportunity to ask questions. Patient verbalized understanding of the plan and was able to repeat key elements of the plan. All questions were answered to their satisfaction.   I have reviewed this encounter including the documentation in this note and/or discussed this patient with Mickael Alamo FNP Student. I am certifying that I agree with the content of this note as the primary care nurse practitioner.  Joanna Epley, DNP, FNP-BC   I, Joanna Epley, FNP, have reviewed all documentation for this visit. The documentation on 09/02/23 for the exam, diagnosis, procedures, and orders are all accurate and  complete.   IF YOU HAVE BEEN REFERRED TO A SPECIALIST, IT MAY TAKE 1-2 WEEKS TO SCHEDULE/PROCESS THE REFERRAL. IF YOU HAVE NOT HEARD FROM US /SPECIALIST IN TWO WEEKS, PLEASE GIVE US  A CALL AT [phone].

## 2023-09-05 LAB — URINE CULTURE

## 2023-09-06 ENCOUNTER — Other Ambulatory Visit: Payer: Self-pay | Admitting: Nurse Practitioner

## 2023-09-06 ENCOUNTER — Encounter: Payer: Self-pay | Admitting: Nurse Practitioner

## 2023-09-06 DIAGNOSIS — N3000 Acute cystitis without hematuria: Secondary | ICD-10-CM

## 2023-09-06 MED ORDER — NITROFURANTOIN MONOHYD MACRO 100 MG PO CAPS
100.0000 mg | ORAL_CAPSULE | Freq: Two times a day (BID) | ORAL | 0 refills | Status: AC
Start: 2023-09-06 — End: 2023-09-13

## 2023-09-14 DIAGNOSIS — R350 Frequency of micturition: Secondary | ICD-10-CM | POA: Insufficient documentation

## 2023-09-14 DIAGNOSIS — R300 Dysuria: Secondary | ICD-10-CM | POA: Insufficient documentation

## 2023-09-14 DIAGNOSIS — E66812 Obesity, class 2: Secondary | ICD-10-CM | POA: Insufficient documentation

## 2023-09-14 NOTE — Assessment & Plan Note (Signed)
 She is encouraged to strive for BMI less than 30 to decrease cardiac risk. Advised to aim for at least 150 minutes of exercise per week.

## 2023-09-14 NOTE — Assessment & Plan Note (Signed)
 Declines shingrix, educated on disease process and is aware if he changes his mind to notify office

## 2023-09-14 NOTE — Assessment & Plan Note (Signed)
 Urinalysis trace leukocytes, will send for urine culture. Encouraged to stay well hydrated with water and avoid dark sodas

## 2023-09-14 NOTE — Assessment & Plan Note (Signed)

## 2023-10-20 LAB — LAB REPORT - SCANNED
Creatinine, POC: 137 mg/dL
EGFR: 41

## 2023-10-28 ENCOUNTER — Other Ambulatory Visit: Payer: Self-pay | Admitting: Nurse Practitioner

## 2023-10-28 DIAGNOSIS — E559 Vitamin D deficiency, unspecified: Secondary | ICD-10-CM

## 2023-11-02 NOTE — Progress Notes (Unsigned)
 Del Favia, CMA,acting as a Neurosurgeon for [name], FNP.,have documented all relevant documentation on the behalf of [name], FNP,as directed by  [name], FNP while in the presence of [name], FNP.  Subjective:  Patient ID: Joanna Powell , female    DOB: 05-03-1956 , 68 y.o.   MRN: [id number]  No chief complaint on file.   HPI  HPI   Past Medical History:  Diagnosis Date   Hypertension    Pure hypercholesterolemia 01/24/2020     Family History  Problem Relation Age of Onset   Breast cancer Sister    Cancer Mother    Hypertension Mother    CAD Mother    Hypertension Father    Heart disease Father    CAD Brother      Current Outpatient Medications:    amLODipine -olmesartan  (AZOR ) 5-20 MG tablet, TAKE 1 TABLET BY MOUTH EVERY DAY, Disp: 90 tablet, Rfl: 3   atorvastatin  (LIPITOR) 80 MG tablet, Take 1 tablet (80 mg total) by mouth daily., Disp: 90 tablet, Rfl: 1   furosemide  (LASIX ) 20 MG tablet, Take 1-2 tablets in the morning as needed for leg swelling., Disp: 20 tablet, Rfl: 0   Vitamin D , Ergocalciferol , (DRISDOL ) 1.25 MG (50000 UNIT) CAPS capsule, TAKE 1 CAPSULE (50,000 UNITS) BY MOUTH TWO TIMES A WEEK, Disp: 12 capsule, Rfl: 1   No Known Allergies   Review of Systems   There were no vitals filed for this visit. There is no height or weight on file to calculate BMI.  Wt Readings from Last 3 Encounters:  09/02/23 193 lb 6.4 oz (87.7 kg)  08/12/23 188 lb 12.8 oz (85.6 kg)  07/06/23 190 lb 12.8 oz (86.5 kg)    The 10-year ASCVD risk score (Arnett DK, et al., 2019) is: 8.5%   Values used to calculate the score:     Age: 39 years     Sex: Female     Is Non-Hispanic African American: Yes     Diabetic: No     Tobacco smoker: No     Systolic Blood Pressure: 128 mmHg     Is BP treated: Yes     HDL Cholesterol: 50 mg/dL     Total Cholesterol: 152 mg/dL  Objective:  Physical Exam      Assessment And Plan:  Benign hypertension with chronic kidney  disease, stage III (HCC)  Pure hypercholesterolemia    No follow-ups on file.  Patient was given opportunity to ask questions. Patient verbalized understanding of the plan and was able to repeat key elements of the plan. All questions were answered to their satisfaction.    Inge Mangle, FNP, have reviewed all documentation for this visit. The documentation on 11/02/23 for the exam, diagnosis, procedures, and orders are all accurate and complete.   IF YOU HAVE BEEN REFERRED TO A SPECIALIST, IT MAY TAKE 1-2 WEEKS TO SCHEDULE/PROCESS THE REFERRAL. IF YOU HAVE NOT HEARD FROM US /SPECIALIST IN TWO WEEKS, PLEASE GIVE US  A CALL AT [phone].

## 2023-11-03 ENCOUNTER — Encounter: Payer: Self-pay | Admitting: Nurse Practitioner

## 2023-11-03 ENCOUNTER — Ambulatory Visit: Payer: 59 | Admitting: Nurse Practitioner

## 2023-11-03 VITALS — BP 110/72 | HR 68 | Temp 98.5°F | Ht 62.0 in | Wt 193.4 lb

## 2023-11-03 DIAGNOSIS — Z2821 Immunization not carried out because of patient refusal: Secondary | ICD-10-CM

## 2023-11-03 DIAGNOSIS — N183 Chronic kidney disease, stage 3 unspecified: Secondary | ICD-10-CM | POA: Diagnosis not present

## 2023-11-03 DIAGNOSIS — E6609 Other obesity due to excess calories: Secondary | ICD-10-CM

## 2023-11-03 DIAGNOSIS — I129 Hypertensive chronic kidney disease with stage 1 through stage 4 chronic kidney disease, or unspecified chronic kidney disease: Secondary | ICD-10-CM

## 2023-11-03 DIAGNOSIS — I700 Atherosclerosis of aorta: Secondary | ICD-10-CM | POA: Diagnosis not present

## 2023-11-03 DIAGNOSIS — Z6835 Body mass index (BMI) 35.0-35.9, adult: Secondary | ICD-10-CM

## 2023-11-03 DIAGNOSIS — E7800 Pure hypercholesterolemia, unspecified: Secondary | ICD-10-CM | POA: Diagnosis not present

## 2023-11-03 DIAGNOSIS — E66812 Obesity, class 2: Secondary | ICD-10-CM

## 2023-11-03 NOTE — Assessment & Plan Note (Signed)
 She is encouraged to strive for BMI less than 30 to decrease cardiac risk. Advised to aim for at least 150 minutes of exercise per week.

## 2023-11-03 NOTE — Assessment & Plan Note (Addendum)
 Blood pressure is well controlled, continue current medications, she is now on Jardiance by the Nephrologist to help with her kidney function. She will f/u with them in 2 months. Reassured her on the benefits of jardiance to help with kidney protection, cardiac protection and helps with blood pressure and glucose

## 2023-11-03 NOTE — Assessment & Plan Note (Signed)
Cholesterol levels are stable, continue statin, tolerating well.  

## 2023-11-03 NOTE — Assessment & Plan Note (Signed)

## 2023-11-03 NOTE — Assessment & Plan Note (Signed)
 Continue statin, tolerating well

## 2023-11-14 ENCOUNTER — Encounter: Payer: Self-pay | Admitting: Nurse Practitioner

## 2023-11-15 ENCOUNTER — Encounter (HOSPITAL_BASED_OUTPATIENT_CLINIC_OR_DEPARTMENT_OTHER): Payer: Self-pay | Admitting: Cardiovascular Disease

## 2023-11-16 ENCOUNTER — Other Ambulatory Visit (HOSPITAL_BASED_OUTPATIENT_CLINIC_OR_DEPARTMENT_OTHER): Payer: Self-pay | Admitting: Cardiovascular Disease

## 2023-12-07 ENCOUNTER — Encounter: Payer: Self-pay | Admitting: Nurse Practitioner

## 2023-12-07 ENCOUNTER — Encounter (HOSPITAL_BASED_OUTPATIENT_CLINIC_OR_DEPARTMENT_OTHER): Payer: Self-pay | Admitting: Cardiovascular Disease

## 2023-12-07 DIAGNOSIS — I10 Essential (primary) hypertension: Secondary | ICD-10-CM

## 2023-12-07 NOTE — Telephone Encounter (Signed)
 Please review BP log and review

## 2023-12-08 NOTE — Telephone Encounter (Signed)
 SABRA

## 2023-12-27 MED ORDER — AMLODIPINE-OLMESARTAN 10-40 MG PO TABS: 1.0000 | ORAL_TABLET | Freq: Every day | ORAL | 2 refills | Status: DC

## 2024-01-06 ENCOUNTER — Encounter: Payer: Self-pay | Admitting: Nurse Practitioner

## 2024-01-06 ENCOUNTER — Ambulatory Visit (INDEPENDENT_AMBULATORY_CARE_PROVIDER_SITE_OTHER): Admitting: Nurse Practitioner

## 2024-01-06 VITALS — BP 100/60 | HR 61 | Temp 98.7°F | Ht 62.0 in | Wt 192.6 lb

## 2024-01-06 DIAGNOSIS — N183 Chronic kidney disease, stage 3 unspecified: Secondary | ICD-10-CM

## 2024-01-06 DIAGNOSIS — I700 Atherosclerosis of aorta: Secondary | ICD-10-CM | POA: Diagnosis not present

## 2024-01-06 DIAGNOSIS — Z0000 Encounter for general adult medical examination without abnormal findings: Secondary | ICD-10-CM | POA: Diagnosis not present

## 2024-01-06 DIAGNOSIS — R7303 Prediabetes: Secondary | ICD-10-CM

## 2024-01-06 DIAGNOSIS — Z6835 Body mass index (BMI) 35.0-35.9, adult: Secondary | ICD-10-CM

## 2024-01-06 DIAGNOSIS — I129 Hypertensive chronic kidney disease with stage 1 through stage 4 chronic kidney disease, or unspecified chronic kidney disease: Secondary | ICD-10-CM

## 2024-01-06 DIAGNOSIS — Z79899 Other long term (current) drug therapy: Secondary | ICD-10-CM

## 2024-01-06 DIAGNOSIS — E7800 Pure hypercholesterolemia, unspecified: Secondary | ICD-10-CM

## 2024-01-06 DIAGNOSIS — Z2821 Immunization not carried out because of patient refusal: Secondary | ICD-10-CM

## 2024-01-06 DIAGNOSIS — E66812 Obesity, class 2: Secondary | ICD-10-CM

## 2024-01-06 DIAGNOSIS — H6122 Impacted cerumen, left ear: Secondary | ICD-10-CM

## 2024-01-06 DIAGNOSIS — E6609 Other obesity due to excess calories: Secondary | ICD-10-CM

## 2024-01-06 DIAGNOSIS — R051 Acute cough: Secondary | ICD-10-CM

## 2024-01-06 DIAGNOSIS — E559 Vitamin D deficiency, unspecified: Secondary | ICD-10-CM

## 2024-01-06 LAB — POCT URINALYSIS DIP (CLINITEK)
Bilirubin, UA: NEGATIVE
Glucose, UA: NEGATIVE mg/dL
Ketones, POC UA: NEGATIVE mg/dL
Leukocytes, UA: NEGATIVE
Nitrite, UA: NEGATIVE
POC PROTEIN,UA: NEGATIVE
Spec Grav, UA: 1.01 (ref 1.010–1.025)
Urobilinogen, UA: 0.2 U/dL
pH, UA: 6.5 (ref 5.0–8.0)

## 2024-01-06 NOTE — Assessment & Plan Note (Addendum)
 Routine wellness visit with focus on exercise, diet, and vaccine discussion. Declined pneumonia vaccine despite benefits discussion. - Behavior modifications discussed and diet history reviewed.   - Encourage 150 minutes of exercise per week, including walking and strength training. - Advise on diet rich in vegetables and fruits, reducing fried foods, fast foods, and sweets. - Discuss benefits of pneumonia vaccine for individuals over 65 with chronic health issues. - Recommend mammogram and colonoscopy for preventive screenings, as well as recommend immunizations that include influenza, TDAP, and Shingles - I have advised her when she gets her Medicare insurance to make us  aware, she plans to retire in 2026.

## 2024-01-06 NOTE — Assessment & Plan Note (Signed)
Hemoglobin A1c stable.  Will check levels today.  Diet controlled.

## 2024-01-06 NOTE — Assessment & Plan Note (Signed)
 Declines shingrix, educated on disease process and is aware if he changes his mind to notify office

## 2024-01-06 NOTE — Assessment & Plan Note (Signed)
 Will check vitamin D level and supplement as needed.    Also encouraged to spend 15 minutes in the sun daily.

## 2024-01-06 NOTE — Assessment & Plan Note (Addendum)
 Blood pressure well-controlled at 100/60 mmHg. Medication adjusted to amlodipine -olmesartan  10-40 mg due to issues with empagliflozin. - Maintain amlodipine -olmesartan  10-40 mg regimen and f/u with Cardiology.  - EKG done with NSR HR 61

## 2024-01-06 NOTE — Assessment & Plan Note (Signed)
 Significant cerumen impaction causing potential hearing issues. Cerumen is soft, complicating removal. - Use Debrox drops to soften cerumen. - Avoid Q-tips. - Reassess if hearing issues persist after treatment.

## 2024-01-06 NOTE — Assessment & Plan Note (Signed)
Cholesterol levels are stable, continue statin, tolerating well.  

## 2024-01-06 NOTE — Assessment & Plan Note (Signed)

## 2024-01-06 NOTE — Assessment & Plan Note (Signed)
 Continue statin, tolerating well

## 2024-01-06 NOTE — Assessment & Plan Note (Signed)
 Cough likely due to postnasal drainage, possibly from allergies or air quality. Claritin used without improvement. - Use Claritin for at least two weeks. - Consider probiotic for potential reflux. - Monitor symptoms and reassess if no improvement.

## 2024-01-06 NOTE — Progress Notes (Signed)
 LILLETTE Kristeen JINNY Gladis, CMA,acting as a Neurosurgeon for [name], FNP.,have documented all relevant documentation on the behalf of [name], FNP,as directed by  [name], FNP while in the presence of [name], FNP.  Subjective:    Patient ID: Joanna Powell , female    DOB: September 24, 1955 , 68 y.o.   MRN: [id number]  Chief Complaint  Patient presents with   Annual Exam    Patient presents today for HM, Patient reports compliance with medication. Patient denies any chest pain, SOB, or headaches. Patient has no concerns today.    Cough    Patient reports for the last month she has had a cough, she reports she feels a tickle and she is having a lot of coughing.     HPI  HPI  Discussed the use of AI scribe software for clinical note transcription with the patient, who gave verbal consent to proceed.  History of Present Illness Joanna Powell is a 68 year old female who presents for an annual physical exam.  She recently experienced adverse effects from [place], including fluctuating blood pressure, lightheadedness, and faintness, leading to its discontinuation by Dr. Annita (Nephrology) who is now retired. She is now on amlodipine /olmesartan  10 mg40mg , with her medication adjusted from 5 mg to 10 mg. She is advised to monitor her blood pressure regularly by her Cardiologist.  She has had a persistent cough for the past month, described as a 'tickle' in her throat, leading to frequent coughing both day and night, sometimes waking her up. Claritin was tried without relief x 2 days. No fever, reflux, or other symptoms accompany the cough.  Her physical activity has decreased recently, but she is attempting to resume regular exercise, aiming to incorporate more walking and strength training. She works full-time at [employer] and finds it challenging to participate in daytime exercise programs.  Her diet focuses on fruits and vegetables, with efforts to reduce fried foods, fast foods, and sweets. She  does not follow a specific diet plan.  No abnormal vaginal bleeding, urinary issues, constipation, diarrhea, shortness of breath, chest pain, difficulty swallowing, or hearing issues.   Past Medical History:  Diagnosis Date   Allergy    Seasonal   Arthritis    Knees   Cataract    Both eyes   Hypertension    Pure hypercholesterolemia 01/24/2020     Family History  Problem Relation Age of Onset   Breast cancer Sister    Cancer Mother    Hypertension Mother    CAD Mother    Hypertension Father    Heart disease Father    CAD Brother    Diabetes Son    Obesity Son      Current Outpatient Medications:    amLODipine -olmesartan  (AZOR ) 10-40 MG tablet, Take 1 tablet by mouth daily., Disp: 90 tablet, Rfl: 2   atorvastatin  (LIPITOR) 80 MG tablet, Take 1 tablet (80 mg total) by mouth daily., Disp: 90 tablet, Rfl: 1   Vitamin D , Ergocalciferol , (DRISDOL ) 1.25 MG (50000 UNIT) CAPS capsule, TAKE 1 CAPSULE (50,000 UNITS) BY MOUTH TWO TIMES A WEEK, Disp: 12 capsule, Rfl: 1   No Known Allergies    The patient states she uses post menopausal status for birth control. No LMP recorded. Patient is postmenopausal.. Negative for Dysmenorrhea and Negative for Menorrhagia. Negative for: breast discharge, breast lump(s), breast pain and breast self exam. Associated symptoms include abnormal vaginal bleeding. Pertinent negatives include abnormal bleeding (hematology), anxiety, decreased libido, depression, difficulty falling sleep, dyspareunia,  history of infertility, nocturia, sexual dysfunction, sleep disturbances, urinary incontinence, urinary urgency, vaginal discharge and vaginal itching. Diet regular.The patient states her exercise level is minimal.   The patient's tobacco use is:  Social History   Tobacco Use  Smoking Status Former  Smokeless Tobacco Never   She has been exposed to passive smoke. The patient's alcohol use is:  Social History   Substance and Sexual Activity  Alcohol Use  Yes   Comment: occasional     Review of Systems  Constitutional: Negative.   Eyes: Negative.   Respiratory: Negative.    Cardiovascular: Negative.   Musculoskeletal: Negative.   Skin: Negative.   Neurological: Negative.   Psychiatric/Behavioral: Negative.       Today's Vitals   01/06/24 0847  BP: 100/60  Pulse: 61  Temp: 98.7 F (37.1 C)  TempSrc: Oral  Weight: 192 lb 9.6 oz (87.4 kg)  Height: 5' 2 (1.575 m)  PainSc: 0-No pain   Body mass index is 35.23 kg/m.  Wt Readings from Last 3 Encounters:  01/06/24 192 lb 9.6 oz (87.4 kg)  11/03/23 193 lb 6.4 oz (87.7 kg)  09/02/23 193 lb 6.4 oz (87.7 kg)     Objective:  Physical Exam Vitals and nursing note reviewed.  Constitutional:      General: She is not in acute distress.    Appearance: Normal appearance. She is well-developed. She is obese.  HENT:     Head: Normocephalic and atraumatic.     Right Ear: Hearing, tympanic membrane, ear canal and external ear normal.     Left Ear: Hearing, tympanic membrane, ear canal and external ear normal.     Nose: Nose normal.     Mouth/Throat:     Mouth: Mucous membranes are moist.  Eyes:     General: Lids are normal.        Right eye: No discharge.        Left eye: No discharge.     Extraocular Movements: Extraocular movements intact.     Pupils: Pupils are equal, round, and reactive to light.     Funduscopic exam:    Right eye: No papilledema.        Left eye: No papilledema.  Neck:     Thyroid: No thyroid mass.     Vascular: No carotid bruit.  Cardiovascular:     Rate and Rhythm: Normal rate and regular rhythm.     Pulses: Normal pulses.     Heart sounds: Normal heart sounds. No murmur heard. Pulmonary:     Effort: Pulmonary effort is normal. No respiratory distress.     Breath sounds: Normal breath sounds. No wheezing.  Abdominal:     General: Abdomen is flat. Bowel sounds are normal. There is no distension.     Palpations: Abdomen is soft.     Tenderness:  There is no abdominal tenderness.  Genitourinary:    Comments: Deferred - has GYN Musculoskeletal:        General: No swelling or tenderness. Normal range of motion.     Cervical back: Full passive range of motion without pain, normal range of motion and neck supple.     Right lower leg: No edema.     Left lower leg: No edema.  Skin:    General: Skin is warm and dry.     Capillary Refill: Capillary refill takes less than 2 seconds.  Neurological:     General: No focal deficit present.     Mental Status: She  is alert and oriented to person, place, and time.     Cranial Nerves: No cranial nerve deficit.     Sensory: No sensory deficit.  Psychiatric:        Mood and Affect: Mood normal.        Behavior: Behavior normal.        Thought Content: Thought content normal.        Judgment: Judgment normal.      Assessment And Plan:     Encounter for annual health examination Assessment & Plan: Routine wellness visit with focus on exercise, diet, and vaccine discussion. Declined pneumonia vaccine despite benefits discussion. - Behavior modifications discussed and diet history reviewed.   - Encourage 150 minutes of exercise per week, including walking and strength training. - Advise on diet rich in vegetables and fruits, reducing fried foods, fast foods, and sweets. - Discuss benefits of pneumonia vaccine for individuals over 65 with chronic health issues. - Recommend mammogram and colonoscopy for preventive screenings, as well as recommend immunizations that include influenza, TDAP, and Shingles - I have advised her when she gets her Medicare insurance to make us  aware, she plans to retire in 2026.     Benign hypertension with chronic kidney disease, stage III (HCC) Assessment & Plan: Blood pressure well-controlled at 100/60 mmHg. Medication adjusted to amlodipine -olmesartan  10-40 mg due to issues with empagliflozin. - Maintain amlodipine -olmesartan  10-40 mg regimen and f/u with  Cardiology.  - EKG done with NSR HR 61  Orders: -     EKG 12-Lead -     Microalbumin / creatinine urine ratio -     POCT URINALYSIS DIP (CLINITEK)  Pure hypercholesterolemia Assessment & Plan: Cholesterol levels are stable, continue statin, tolerating well.  Orders: -     Lipid panel  Prediabetes Assessment & Plan: Hemoglobin A1c stable.  Will check levels today.  Diet controlled.  Orders: -     Hemoglobin A1c  Vitamin D  deficiency Assessment & Plan: Will check vitamin D  level and supplement as needed.    Also encouraged to spend 15 minutes in the sun daily.    Orders: -     VITAMIN D  25 Hydroxy (Vit-D Deficiency, Fractures)  Acute cough Assessment & Plan: Cough likely due to postnasal drainage, possibly from allergies or air quality. Claritin used without improvement. - Use Claritin for at least two weeks. - Consider probiotic for potential reflux. - Monitor symptoms and reassess if no improvement.   Impacted cerumen of left ear Assessment & Plan: Significant cerumen impaction causing potential hearing issues. Cerumen is soft, complicating removal. - Use Debrox drops to soften cerumen. - Avoid Q-tips. - Reassess if hearing issues persist after treatment.   COVID-19 vaccination declined Assessment & Plan: Declines covid 19 vaccine. Discussed risk of covid 82 and if she changes her mind about the vaccine to call the office. Education has been provided regarding the importance of this vaccine but patient still declined. Advised may receive this vaccine at local pharmacy or Health Dept.or vaccine clinic. Aware to provide a copy of the vaccination record if obtained from local pharmacy or Health Dept.  Encouraged to take multivitamin, vitamin d , vitamin c and zinc to increase immune system. Aware can call office if would like to have vaccine here at office. Verbalized acceptance and understanding.    Herpes zoster vaccination declined Assessment & Plan: Declines  shingrix, educated on disease process and is aware if he changes his mind to notify office    Class 2 obesity  due to excess calories with body mass index (BMI) of 35.0 to 35.9 in adult, unspecified whether serious comorbidity present Assessment & Plan: Weight stable. Discussed weight management challenges and exercise importance. Considering local exercise programs. - Encourage regular physical activity. - Explore local exercise programs at workplace or through insurance.   Other long term (current) drug therapy -     CBC with Differential/Platelet  Atherosclerosis of aorta (HCC) Assessment & Plan: Continue statin, tolerating well      Return for 1 year physical, 6 month bp check. Patient was given opportunity to ask questions. Patient verbalized understanding of the plan and was able to repeat key elements of the plan. All questions were answered to their satisfaction.   [name], FNP  I, [name], FNP, have reviewed all documentation for this visit. The documentation on 01/06/24 for the exam, diagnosis, procedures, and orders are all accurate and complete.

## 2024-01-06 NOTE — Assessment & Plan Note (Signed)
 Weight stable. Discussed weight management challenges and exercise importance. Considering local exercise programs. - Encourage regular physical activity. - Explore local exercise programs at workplace or through insurance.

## 2024-01-07 ENCOUNTER — Encounter: Payer: Self-pay | Admitting: Nurse Practitioner

## 2024-01-07 LAB — CBC WITH DIFFERENTIAL/PLATELET
Basophils Absolute: 0.1 x10E3/uL (ref 0.0–0.2)
Basos: 1 %
EOS (ABSOLUTE): 0.1 x10E3/uL (ref 0.0–0.4)
Eos: 2 %
Hematocrit: 41.1 % (ref 34.0–46.6)
Hemoglobin: 12.9 g/dL (ref 11.1–15.9)
Immature Grans (Abs): 0 x10E3/uL (ref 0.0–0.1)
Immature Granulocytes: 0 %
Lymphocytes Absolute: 1.6 x10E3/uL (ref 0.7–3.1)
Lymphs: 20 %
MCH: 26.5 pg — ABNORMAL LOW (ref 26.6–33.0)
MCHC: 31.4 g/dL — ABNORMAL LOW (ref 31.5–35.7)
MCV: 85 fL (ref 79–97)
Monocytes Absolute: 0.7 x10E3/uL (ref 0.1–0.9)
Monocytes: 8 %
Neutrophils Absolute: 5.7 x10E3/uL (ref 1.4–7.0)
Neutrophils: 69 %
Platelets: 399 x10E3/uL (ref 150–450)
RBC: 4.86 x10E6/uL (ref 3.77–5.28)
RDW: 14 % (ref 11.7–15.4)
WBC: 8.1 x10E3/uL (ref 3.4–10.8)

## 2024-01-07 LAB — LIPID PANEL
Chol/HDL Ratio: 2.9 ratio (ref 0.0–4.4)
Cholesterol, Total: 158 mg/dL (ref 100–199)
HDL: 54 mg/dL (ref >39)
LDL Chol Calc (NIH): 84 mg/dL (ref 0–99)
Triglycerides: 112 mg/dL (ref 0–149)
VLDL Cholesterol Cal: 20 mg/dL (ref 5–40)

## 2024-01-07 LAB — HEMOGLOBIN A1C
Est. average glucose Bld gHb Est-mCnc: 123 mg/dL
Hgb A1c MFr Bld: 5.9 % — ABNORMAL HIGH (ref 4.8–5.6)

## 2024-01-07 LAB — MICROALBUMIN / CREATININE URINE RATIO
Creatinine, Urine: 114.7 mg/dL
Microalb/Creat Ratio: 9 mg/g{creat} (ref 0–29)
Microalbumin, Urine: 10.5 ug/mL

## 2024-01-07 LAB — VITAMIN D 25 HYDROXY (VIT D DEFICIENCY, FRACTURES): Vit D, 25-Hydroxy: 102 ng/mL — ABNORMAL HIGH (ref 30.0–100.0)

## 2024-01-28 ENCOUNTER — Other Ambulatory Visit: Payer: Self-pay | Admitting: Nurse Practitioner

## 2024-01-28 DIAGNOSIS — E559 Vitamin D deficiency, unspecified: Secondary | ICD-10-CM

## 2024-02-21 ENCOUNTER — Encounter (HOSPITAL_BASED_OUTPATIENT_CLINIC_OR_DEPARTMENT_OTHER): Payer: Self-pay | Admitting: Family

## 2024-02-21 ENCOUNTER — Ambulatory Visit (HOSPITAL_BASED_OUTPATIENT_CLINIC_OR_DEPARTMENT_OTHER): Admitting: Family

## 2024-02-21 VITALS — BP 114/66 | HR 64 | Resp 17 | Ht 62.0 in | Wt 190.0 lb

## 2024-02-21 DIAGNOSIS — R252 Cramp and spasm: Secondary | ICD-10-CM

## 2024-02-21 DIAGNOSIS — E782 Mixed hyperlipidemia: Secondary | ICD-10-CM | POA: Diagnosis not present

## 2024-02-21 DIAGNOSIS — I10 Essential (primary) hypertension: Secondary | ICD-10-CM

## 2024-02-21 NOTE — Progress Notes (Signed)
 Cardiology Office Note   Date:  02/21/2024  ID:  Najah Liverman, DOB [birth year], MRN [id number] PCP: Ada Speaks, FNP  [facility] Providers Cardiologist:  None     History of Present Illness Joanna Powell is a 68 y.o. female with hx of HTN, HLD, CKDIIIb, aortic atherosclerosis.   She was last seen 08/12/23 by Dr. Raford. BP was elevated intermittently, home monitoring recommended. Via subsequent MyChart message Amlodipine -Olmesartan  increased to 10-40mg  daily.  Presents today for follow up. Feeling well since last seen. Reports no shortness of breath nor dyspnea on exertion. Reports no chest pain, pressure, or tightness. No edema, orthopnea, PND. Reports no palpitations.  Interested in increasing her physical activity. Has made dietary changes to more heart healthy diet.   ROS: Please see the history of present illness.    All other systems reviewed and are negative.   Studies Reviewed      Cardiac Studies & Procedures   ______________________________________________________________________________________________          CT SCANS  CT CARDIAC SCORING (SELF PAY ONLY) 02/21/2021  Addendum 02/21/2021 11:36 AM ADDENDUM REPORT: 02/21/2021 11:34  CLINICAL DATA:  Cardiovascular Disease Risk stratification  EXAM: Coronary Calcium  Score  TECHNIQUE: A gated, non-contrast computed tomography scan of the heart was performed using 3mm slice thickness. Axial images were analyzed on a dedicated workstation. Calcium  scoring of the coronary arteries was performed using the Agatston method.  FINDINGS: Coronary arteries: Normal origins.  Coronary Calcium  Score:  Left main: 0  Left anterior descending artery: 97  Left circumflex artery: 41  Right coronary artery: 157  Total: 295  Percentile: 94  Pericardium: Normal.  Ascending Aorta: Normal caliber.  (34 mm).  Aortic atherosclerosis.  Non-cardiac: See separate report from [facility]  Radiology.  IMPRESSION: 1. Coronary calcium  score of 295. This was 83 percentile for age-, race-, and sex-matched controls.  2.  Aortic atherosclerosis.  RECOMMENDATIONS: Coronary artery calcium  (CAC) score is a strong predictor of incident coronary heart disease (CHD) and provides predictive information beyond traditional risk factors. CAC scoring is reasonable to use in the decision to withhold, postpone, or initiate statin therapy in intermediate-risk or selected borderline-risk asymptomatic adults (age 30-75 years and LDL-C >=70 to <190 mg/dL) who do not have diabetes or established atherosclerotic cardiovascular disease (ASCVD).* In intermediate-risk (10-year ASCVD risk >=7.5% to <20%) adults or selected borderline-risk (10-year ASCVD risk >=5% to <7.5%) adults in whom a CAC score is measured for the purpose of making a treatment decision the following recommendations have been made:  If CAC=0, it is reasonable to withhold statin therapy and reassess in 5 to 10 years, as long as higher risk conditions are absent (diabetes mellitus, family history of premature CHD in first degree relatives (males <55 years; females <65 years), cigarette smoking, or LDL >=190 mg/dL).  If CAC is 1 to 99, it is reasonable to initiate statin therapy for patients >=33 years of age.  If CAC is >=100 or >=75th percentile, it is reasonable to initiate statin therapy at any age.  Cardiology referral should be considered for patients with CAC scores >=400 or >=75th percentile.  *2018 AHA/ACC/AACVPR/AAPA/ABC/ACPM/ADA/AGS/APhA/ASPC/NLA/PCNA Guideline on the Management of Blood Cholesterol: A Report of the American College of Cardiology/American Heart Association Task Force on Clinical Practice Guidelines. J Am Coll Cardiol. 2019;73(24):3168-3209.   Electronically Signed By: Oneil Parchment M.D. On: 02/21/2021 11:34  Narrative EXAM: OVER-READ INTERPRETATION  CT CHEST  The following report is  an over-read performed by radiologist Dr. Toribio Aye of [facility] on 02/21/2021. This over-read does not include interpretation of cardiac or coronary anatomy or pathology. The coronary calcium  score interpretation by the cardiologist is attached.  COMPARISON:  No priors.  FINDINGS: Atherosclerotic calcifications in the thoracic aorta. Within the visualized portions of the thorax there are no suspicious appearing pulmonary nodules or masses, there is no acute consolidative airspace disease, no pleural effusions, no pneumothorax and no lymphadenopathy. Visualized portions of the upper abdomen are unremarkable. There are no aggressive appearing lytic or blastic lesions noted in the visualized portions of the skeleton.  IMPRESSION: 1.  Aortic Atherosclerosis (ICD10-I70.0).  Electronically Signed: By: Toribio Aye M.D. On: 02/21/2021 08:11     ______________________________________________________________________________________________      Risk Assessment/Calculations           Physical Exam VS:  BP 114/66 (BP Location: Left Arm, Patient Position: Sitting, Cuff Size: Large)   Pulse 64   Resp 17   Ht 5' 2 (1.575 m)   Wt 190 lb (86.2 kg)   SpO2 97%   BMI 34.75 kg/m        Wt Readings from Last 3 Encounters:  02/21/24 190 lb (86.2 kg)  01/06/24 192 lb 9.6 oz (87.4 kg)  11/03/23 193 lb 6.4 oz (87.7 kg)    GEN: Well nourished, well developed in no acute distress NECK: No JVD; No carotid bruits CARDIAC: RRR, no murmurs, rubs, gallops RESPIRATORY:  Clear to auscultation without rales, wheezing or rhonchi  ABDOMEN: Soft, non-tender, non-distended EXTREMITIES:  No edema; No deformity   ASSESSMENT AND PLAN  HTN - BP well controlled. Continue current antihypertensive regimen Amlodipine -Valsartan 10-40mg  daily. Discussed to monitor BP at home at least 2 hours after medications and sitting for 5-10 minutes.   HLD - Continue Atorvastatin  80mg  daily.  Plan to participate in PREP program. Consider repeat lipid panel at clinic visit in 6 mos. If LDL remains  above goal <70, consider Zetia at that time.  CKD IIIb - follows with nephrology. Independently stopped Jardiance, plans to discuss with nephrology.   Leg cramps - update BMET, magnesium due to leg cramps.       Dispo: follow up in 6 months with Dr. Raford or APP  Signed, Reche GORMAN Finder, NP

## 2024-02-21 NOTE — Patient Instructions (Addendum)
 Medication Instructions:  Continue your current medications.  *If you need a refill on your cardiac medications before your next appointment, please call your pharmacy*  Labs: Your physician recommends that you return for lab work today: BMET, magnesium  Follow-Up: At [facility], you and your health needs are our priority.  As part of our continuing mission to provide you with exceptional heart care, our providers are all part of one team.  This team includes your primary Cardiologist (physician) and Advanced Practice Providers or APPs (Physician Assistants and Nurse Practitioners) who all work together to provide you with the care you need, when you need it.  Your next appointment:   6 month(s)  Provider:   Annabella Scarce, MD, Rosaline Bane, NP, or Reche Finder, NP    We recommend signing up for the patient portal called MyChart.  Sign up information is provided on this After Visit Summary.  MyChart is used to connect with patients for Virtual Visits (Telemedicine).  Patients are able to view lab/test results, encounter notes, upcoming appointments, etc.  Non-urgent messages can be sent to your provider as well.   To learn more about what you can do with MyChart, go to [email or web address].   Other Instructions  To prevent or reduce lower extremity swelling: Eat a low salt diet. Salt makes the body hold onto extra fluid which causes swelling. Sit with legs elevated. For example, in the recliner or on an ottoman.  Wear knee-high compression stockings during the daytime. Ones labeled 15-20 mmHg provide good compression.

## 2024-02-22 ENCOUNTER — Ambulatory Visit (HOSPITAL_BASED_OUTPATIENT_CLINIC_OR_DEPARTMENT_OTHER): Payer: Self-pay | Admitting: Family

## 2024-02-22 ENCOUNTER — Telehealth: Payer: Self-pay | Admitting: Cardiovascular Disease

## 2024-02-22 DIAGNOSIS — I129 Hypertensive chronic kidney disease with stage 1 through stage 4 chronic kidney disease, or unspecified chronic kidney disease: Secondary | ICD-10-CM

## 2024-02-22 DIAGNOSIS — I10 Essential (primary) hypertension: Secondary | ICD-10-CM

## 2024-02-22 DIAGNOSIS — Z79899 Other long term (current) drug therapy: Secondary | ICD-10-CM

## 2024-02-22 LAB — BASIC METABOLIC PANEL WITH GFR
BUN/Creatinine Ratio: 13 (ref 12–28)
BUN: 23 mg/dL (ref 8–27)
CO2: 21 mmol/L (ref 20–29)
Calcium: 10.2 mg/dL (ref 8.7–10.3)
Chloride: 101 mmol/L (ref 96–106)
Creatinine, Ser: 1.83 mg/dL — ABNORMAL HIGH (ref 0.57–1.00)
Glucose: 98 mg/dL (ref 70–99)
Potassium: 5.6 mmol/L — ABNORMAL HIGH (ref 3.5–5.2)
Sodium: 141 mmol/L (ref 134–144)
eGFR: 30 mL/min/1.73 — ABNORMAL LOW (ref >59)

## 2024-02-22 LAB — MAGNESIUM: Magnesium: 2.3 mg/dL (ref 1.6–2.3)

## 2024-02-22 MED ORDER — AMLODIPINE-OLMESARTAN 10-20 MG PO TABS: 1.0000 | ORAL_TABLET | Freq: Every day | ORAL | 0 refills | Status: DC

## 2024-02-22 NOTE — Telephone Encounter (Signed)
 Patient is returning call. Please advise?

## 2024-02-22 NOTE — Telephone Encounter (Signed)
-----   Message from Reche GORMAN Finder sent at 02/22/2024  8:12 AM EDT ----- Kidney function slightly declined from previous. Potassium level elevated. Normal magnesium.    Elevated potassium may be related to Olmesartan  dosing. Please hold Amlodipine -Olmesartan  for 1 day and then start new Rx for Amlodipine -Olmesartan  10-20mg  daily. Repeat BMET in 1 week for  monitoring.  ----- Message ----- From: Interface, [facility] Lab Results In Sent: 02/22/2024   2:36 AM EDT To: Reche GORMAN Finder, NP     The patient has been notified of the result and verbalized understanding.  All questions (if any) were answered.   Pt aware to HOLD Amlodipine -olmesartan  for one day (HOLD ON 9/24), then take decreased dose of amlodipine -olmesartan  10-20 mg po daily starting on Thursday (9/25) and thereafter.    Pt aware to come in for repeat BMET in one week for monitoring.    Confirmed the pharmacy of choice with the pt.

## 2024-02-22 NOTE — Telephone Encounter (Signed)
-----   Message from Reche GORMAN Finder sent at 02/22/2024  8:12 AM EDT ----- Kidney function slightly declined from previous. Potassium level elevated. Normal magnesium.   Elevated potassium may be related to Olmesartan  dosing. Please hold Amlodipine -Olmesartan  for 1 day and then start new Rx for Amlodipine -Olmesartan  10-20mg  daily. Repeat BMET in 1 week for  monitoring.  ----- Message ----- From: Interface, [facility] Lab Results In Sent: 02/22/2024   2:36 AM EDT To: Reche GORMAN Finder, NP

## 2024-02-22 NOTE — Telephone Encounter (Signed)
 The patient has been notified of the result and verbalized understanding.  All questions (if any) were answered.  Pt aware to HOLD Amlodipine -olmesartan  for one day (HOLD ON 9/24), then take decreased dose of amlodipine -olmesartan  10-20 mg po daily starting on Thursday (9/25) and thereafter.   Pt aware to come in for repeat BMET in one week for monitoring.   Confirmed the pharmacy of choice with the pt.

## 2024-02-25 ENCOUNTER — Encounter (HOSPITAL_BASED_OUTPATIENT_CLINIC_OR_DEPARTMENT_OTHER): Payer: Self-pay

## 2024-03-02 LAB — BASIC METABOLIC PANEL WITH GFR
BUN/Creatinine Ratio: 10 — ABNORMAL LOW (ref 12–28)
BUN: 16 mg/dL (ref 8–27)
CO2: 23 mmol/L (ref 20–29)
Calcium: 10 mg/dL (ref 8.7–10.3)
Chloride: 103 mmol/L (ref 96–106)
Creatinine, Ser: 1.54 mg/dL — ABNORMAL HIGH (ref 0.57–1.00)
Glucose: 94 mg/dL (ref 70–99)
Potassium: 4.9 mmol/L (ref 3.5–5.2)
Sodium: 142 mmol/L (ref 134–144)
eGFR: 37 mL/min/1.73 — ABNORMAL LOW (ref >59)

## 2024-04-16 ENCOUNTER — Other Ambulatory Visit (HOSPITAL_BASED_OUTPATIENT_CLINIC_OR_DEPARTMENT_OTHER): Payer: Self-pay | Admitting: Cardiovascular Disease

## 2024-04-16 DIAGNOSIS — E7800 Pure hypercholesterolemia, unspecified: Secondary | ICD-10-CM

## 2024-04-18 ENCOUNTER — Other Ambulatory Visit: Payer: Self-pay | Admitting: Nurse Practitioner

## 2024-04-18 DIAGNOSIS — E559 Vitamin D deficiency, unspecified: Secondary | ICD-10-CM

## 2024-05-15 ENCOUNTER — Other Ambulatory Visit (HOSPITAL_BASED_OUTPATIENT_CLINIC_OR_DEPARTMENT_OTHER): Payer: Self-pay | Admitting: Family

## 2024-05-15 DIAGNOSIS — I10 Essential (primary) hypertension: Secondary | ICD-10-CM

## 2024-05-15 DIAGNOSIS — I129 Hypertensive chronic kidney disease with stage 1 through stage 4 chronic kidney disease, or unspecified chronic kidney disease: Secondary | ICD-10-CM

## 2024-05-15 DIAGNOSIS — Z79899 Other long term (current) drug therapy: Secondary | ICD-10-CM

## 2024-05-16 ENCOUNTER — Encounter (HOSPITAL_BASED_OUTPATIENT_CLINIC_OR_DEPARTMENT_OTHER): Payer: Self-pay

## 2024-05-16 ENCOUNTER — Other Ambulatory Visit: Payer: Self-pay

## 2024-05-16 ENCOUNTER — Other Ambulatory Visit (HOSPITAL_BASED_OUTPATIENT_CLINIC_OR_DEPARTMENT_OTHER): Payer: Self-pay

## 2024-05-16 DIAGNOSIS — I10 Essential (primary) hypertension: Secondary | ICD-10-CM

## 2024-05-16 DIAGNOSIS — Z79899 Other long term (current) drug therapy: Secondary | ICD-10-CM

## 2024-05-16 DIAGNOSIS — I129 Hypertensive chronic kidney disease with stage 1 through stage 4 chronic kidney disease, or unspecified chronic kidney disease: Secondary | ICD-10-CM

## 2024-05-16 MED ORDER — AMLODIPINE-OLMESARTAN 10-20 MG PO TABS
1.0000 | ORAL_TABLET | Freq: Every day | ORAL | 3 refills | Status: DC
  Filled 2024-05-16: qty 90, 90d supply, fill #0

## 2024-05-17 ENCOUNTER — Other Ambulatory Visit (HOSPITAL_BASED_OUTPATIENT_CLINIC_OR_DEPARTMENT_OTHER): Payer: Self-pay

## 2024-05-17 MED ORDER — AMLODIPINE-OLMESARTAN 10-20 MG PO TABS: 1.0000 | ORAL_TABLET | Freq: Every day | ORAL | 3 refills | Status: AC

## 2024-05-17 NOTE — Addendum Note (Signed)
 Addended by: MARITZA SOR F on: 05/17/2024 08:22 AM   Modules accepted: Orders

## 2024-06-02 ENCOUNTER — Other Ambulatory Visit: Payer: Self-pay | Admitting: Obstetrics and Gynecology

## 2024-06-02 DIAGNOSIS — Z1231 Encounter for screening mammogram for malignant neoplasm of breast: Secondary | ICD-10-CM

## 2024-07-12 ENCOUNTER — Ambulatory Visit: Payer: Self-pay | Admitting: Nurse Practitioner

## 2024-07-17 ENCOUNTER — Ambulatory Visit

## 2024-09-12 ENCOUNTER — Ambulatory Visit (HOSPITAL_BASED_OUTPATIENT_CLINIC_OR_DEPARTMENT_OTHER): Admitting: Cardiovascular Disease

## 2025-01-10 ENCOUNTER — Encounter: Payer: Self-pay | Admitting: Nurse Practitioner
